# Patient Record
Sex: Male | Born: 1935 | Race: White | Hispanic: No | Marital: Married | Smoking: Former smoker
Health system: Southern US, Community
[De-identification: ages and names within clinical notes are randomized; demographics above are authoritative.]

## PROBLEM LIST (undated history)

## (undated) DIAGNOSIS — K5901 Slow transit constipation: Secondary | ICD-10-CM

## (undated) DIAGNOSIS — T148XXA Other injury of unspecified body region, initial encounter: Secondary | ICD-10-CM

## (undated) DIAGNOSIS — E785 Hyperlipidemia, unspecified: Secondary | ICD-10-CM

## (undated) DIAGNOSIS — R001 Bradycardia, unspecified: Secondary | ICD-10-CM

## (undated) DIAGNOSIS — I10 Essential (primary) hypertension: Secondary | ICD-10-CM

## (undated) DIAGNOSIS — R413 Other amnesia: Secondary | ICD-10-CM

## (undated) DIAGNOSIS — Z8546 Personal history of malignant neoplasm of prostate: Secondary | ICD-10-CM

## (undated) DIAGNOSIS — S99929A Unspecified injury of unspecified foot, initial encounter: Secondary | ICD-10-CM

## (undated) DIAGNOSIS — H66009 Acute suppurative otitis media without spontaneous rupture of ear drum, unspecified ear: Secondary | ICD-10-CM

## (undated) DIAGNOSIS — I872 Venous insufficiency (chronic) (peripheral): Secondary | ICD-10-CM

## (undated) DIAGNOSIS — S8990XA Unspecified injury of unspecified lower leg, initial encounter: Secondary | ICD-10-CM

## (undated) DIAGNOSIS — H409 Unspecified glaucoma: Secondary | ICD-10-CM

## (undated) DIAGNOSIS — I679 Cerebrovascular disease, unspecified: Secondary | ICD-10-CM

## (undated) DIAGNOSIS — R269 Unspecified abnormalities of gait and mobility: Secondary | ICD-10-CM

## (undated) DIAGNOSIS — M1990 Unspecified osteoarthritis, unspecified site: Secondary | ICD-10-CM

## (undated) DIAGNOSIS — G319 Degenerative disease of nervous system, unspecified: Secondary | ICD-10-CM

## (undated) DIAGNOSIS — K5730 Diverticulosis of large intestine without perforation or abscess without bleeding: Secondary | ICD-10-CM

## (undated) DIAGNOSIS — Z86010 Personal history of colonic polyps: Secondary | ICD-10-CM

## (undated) DIAGNOSIS — S99919A Unspecified injury of unspecified ankle, initial encounter: Secondary | ICD-10-CM

## (undated) DIAGNOSIS — F329 Major depressive disorder, single episode, unspecified: Secondary | ICD-10-CM

## (undated) HISTORY — DX: Major depressive disorder, single episode, unspecified: F32.9

## (undated) HISTORY — DX: Hyperlipidemia, unspecified: E78.5

## (undated) HISTORY — DX: Personal history of malignant neoplasm of prostate: Z85.46

## (undated) HISTORY — DX: Personal history of colonic polyps: Z86.010

## (undated) HISTORY — PX: OTHER SURGICAL HISTORY: SHX169

## (undated) HISTORY — DX: Slow transit constipation: K59.01

## (undated) HISTORY — DX: Unspecified injury of unspecified foot, initial encounter: S99.929A

## (undated) HISTORY — DX: Unspecified injury of unspecified ankle, initial encounter: S99.919A

## (undated) HISTORY — DX: Acute suppurative otitis media without spontaneous rupture of ear drum, unspecified ear: H66.009

## (undated) HISTORY — DX: Other injury of unspecified body region, initial encounter: T14.8XXA

## (undated) HISTORY — DX: Diverticulosis of large intestine without perforation or abscess without bleeding: K57.30

## (undated) HISTORY — DX: Bradycardia, unspecified: R00.1

## (undated) HISTORY — DX: Other amnesia: R41.3

## (undated) HISTORY — DX: Unspecified injury of unspecified lower leg, initial encounter: S89.90XA

## (undated) HISTORY — DX: Cerebrovascular disease, unspecified: I67.9

## (undated) HISTORY — DX: Unspecified abnormalities of gait and mobility: R26.9

## (undated) HISTORY — DX: Degenerative disease of nervous system, unspecified: G31.9

## (undated) HISTORY — DX: Essential (primary) hypertension: I10

## (undated) HISTORY — DX: Unspecified osteoarthritis, unspecified site: M19.90

## (undated) HISTORY — DX: Unspecified glaucoma: H40.9

## (undated) HISTORY — DX: Venous insufficiency (chronic) (peripheral): I87.2

---

## 2004-07-04 ENCOUNTER — Ambulatory Visit: Payer: Self-pay | Admitting: Internal Medicine

## 2004-07-15 ENCOUNTER — Ambulatory Visit: Payer: Self-pay

## 2004-09-28 ENCOUNTER — Ambulatory Visit: Payer: Self-pay | Admitting: Internal Medicine

## 2005-05-01 ENCOUNTER — Ambulatory Visit: Payer: Self-pay | Admitting: Gastroenterology

## 2005-05-12 ENCOUNTER — Ambulatory Visit: Payer: Self-pay | Admitting: Gastroenterology

## 2005-05-12 ENCOUNTER — Encounter (INDEPENDENT_AMBULATORY_CARE_PROVIDER_SITE_OTHER): Payer: Self-pay | Admitting: *Deleted

## 2005-05-12 HISTORY — PX: COLONOSCOPY: SHX174

## 2005-05-12 LAB — HM COLONOSCOPY

## 2006-02-07 ENCOUNTER — Ambulatory Visit: Payer: Self-pay | Admitting: Internal Medicine

## 2006-03-30 ENCOUNTER — Ambulatory Visit: Payer: Self-pay | Admitting: Internal Medicine

## 2006-04-27 ENCOUNTER — Ambulatory Visit: Payer: Self-pay | Admitting: Internal Medicine

## 2006-07-17 ENCOUNTER — Ambulatory Visit: Payer: Self-pay

## 2006-07-17 ENCOUNTER — Encounter: Payer: Self-pay | Admitting: Internal Medicine

## 2007-01-02 DIAGNOSIS — E785 Hyperlipidemia, unspecified: Secondary | ICD-10-CM

## 2007-01-02 DIAGNOSIS — F3289 Other specified depressive episodes: Secondary | ICD-10-CM

## 2007-01-02 DIAGNOSIS — Z8546 Personal history of malignant neoplasm of prostate: Secondary | ICD-10-CM | POA: Insufficient documentation

## 2007-01-02 DIAGNOSIS — F329 Major depressive disorder, single episode, unspecified: Secondary | ICD-10-CM

## 2007-01-02 HISTORY — DX: Other specified depressive episodes: F32.89

## 2007-01-02 HISTORY — DX: Major depressive disorder, single episode, unspecified: F32.9

## 2007-01-02 HISTORY — DX: Hyperlipidemia, unspecified: E78.5

## 2007-01-02 HISTORY — DX: Personal history of malignant neoplasm of prostate: Z85.46

## 2007-01-03 ENCOUNTER — Ambulatory Visit: Payer: Self-pay | Admitting: Internal Medicine

## 2007-01-03 DIAGNOSIS — I872 Venous insufficiency (chronic) (peripheral): Secondary | ICD-10-CM | POA: Insufficient documentation

## 2007-01-03 DIAGNOSIS — H409 Unspecified glaucoma: Secondary | ICD-10-CM | POA: Insufficient documentation

## 2007-01-03 HISTORY — DX: Unspecified glaucoma: H40.9

## 2007-01-03 HISTORY — DX: Venous insufficiency (chronic) (peripheral): I87.2

## 2007-01-03 LAB — CONVERTED CEMR LAB
Basophils Relative: 1.1 % — ABNORMAL HIGH (ref 0.0–1.0)
Bilirubin, Direct: 0.1 mg/dL (ref 0.0–0.3)
CO2: 34 meq/L — ABNORMAL HIGH (ref 19–32)
Cholesterol: 204 mg/dL (ref 0–200)
Creatinine, Ser: 1 mg/dL (ref 0.4–1.5)
Eosinophils Relative: 4.1 % (ref 0.0–5.0)
GFR calc Af Amer: 92 mL/min
Glucose, Bld: 85 mg/dL (ref 70–99)
HCT: 39.8 % (ref 39.0–52.0)
Hemoglobin: 14.1 g/dL (ref 13.0–17.0)
Lymphocytes Relative: 22 % (ref 12.0–46.0)
Monocytes Absolute: 0.7 10*3/uL (ref 0.2–0.7)
Monocytes Relative: 15.2 % — ABNORMAL HIGH (ref 3.0–11.0)
Neutro Abs: 2.7 10*3/uL (ref 1.4–7.7)
Neutrophils Relative %: 57.6 % (ref 43.0–77.0)
Potassium: 4.8 meq/L (ref 3.5–5.1)
RDW: 13 % (ref 11.5–14.6)
Sodium: 141 meq/L (ref 135–145)
TSH: 3.12 microintl units/mL (ref 0.35–5.50)
Total Bilirubin: 1.1 mg/dL (ref 0.3–1.2)
Total Protein: 6.6 g/dL (ref 6.0–8.3)
VLDL: 15 mg/dL (ref 0–40)
WBC: 4.8 10*3/uL (ref 4.5–10.5)

## 2007-05-07 ENCOUNTER — Ambulatory Visit: Payer: Self-pay | Admitting: Internal Medicine

## 2007-05-07 LAB — CONVERTED CEMR LAB: Cholesterol, target level: 200 mg/dL

## 2007-10-30 ENCOUNTER — Ambulatory Visit: Payer: Self-pay | Admitting: Internal Medicine

## 2007-10-30 DIAGNOSIS — I10 Essential (primary) hypertension: Secondary | ICD-10-CM

## 2007-10-30 HISTORY — DX: Essential (primary) hypertension: I10

## 2007-10-30 LAB — CONVERTED CEMR LAB
Albumin: 3.4 g/dL — ABNORMAL LOW (ref 3.5–5.2)
Alkaline Phosphatase: 55 units/L (ref 39–117)
Bilirubin, Direct: 0.1 mg/dL (ref 0.0–0.3)
Cholesterol: 192 mg/dL (ref 0–200)
LDL Cholesterol: 128 mg/dL — ABNORMAL HIGH (ref 0–99)
Total CHOL/HDL Ratio: 3.7
Total Protein: 6.4 g/dL (ref 6.0–8.3)
VLDL: 12 mg/dL (ref 0–40)

## 2007-11-05 ENCOUNTER — Ambulatory Visit: Payer: Self-pay | Admitting: Internal Medicine

## 2007-11-05 DIAGNOSIS — R350 Frequency of micturition: Secondary | ICD-10-CM | POA: Insufficient documentation

## 2007-11-05 LAB — CONVERTED CEMR LAB: TSH: 1.85 u[IU]/mL

## 2008-02-24 ENCOUNTER — Telehealth: Payer: Self-pay | Admitting: *Deleted

## 2008-02-26 ENCOUNTER — Telehealth: Payer: Self-pay | Admitting: Internal Medicine

## 2008-03-02 ENCOUNTER — Telehealth: Payer: Self-pay | Admitting: Internal Medicine

## 2008-03-06 ENCOUNTER — Ambulatory Visit: Payer: Self-pay | Admitting: Family Medicine

## 2008-03-11 ENCOUNTER — Telehealth: Payer: Self-pay | Admitting: Internal Medicine

## 2008-07-24 ENCOUNTER — Encounter: Payer: Self-pay | Admitting: Internal Medicine

## 2008-07-24 ENCOUNTER — Ambulatory Visit: Payer: Self-pay

## 2008-09-01 ENCOUNTER — Ambulatory Visit: Payer: Self-pay | Admitting: Internal Medicine

## 2008-09-01 ENCOUNTER — Telehealth: Payer: Self-pay | Admitting: Internal Medicine

## 2008-09-01 DIAGNOSIS — H66009 Acute suppurative otitis media without spontaneous rupture of ear drum, unspecified ear: Secondary | ICD-10-CM | POA: Insufficient documentation

## 2008-09-01 DIAGNOSIS — H6120 Impacted cerumen, unspecified ear: Secondary | ICD-10-CM

## 2008-09-01 HISTORY — DX: Acute suppurative otitis media without spontaneous rupture of ear drum, unspecified ear: H66.009

## 2008-09-10 ENCOUNTER — Telehealth: Payer: Self-pay | Admitting: Internal Medicine

## 2008-09-14 ENCOUNTER — Ambulatory Visit: Payer: Self-pay | Admitting: Internal Medicine

## 2008-09-14 DIAGNOSIS — T169XXA Foreign body in ear, unspecified ear, initial encounter: Secondary | ICD-10-CM | POA: Insufficient documentation

## 2009-01-27 ENCOUNTER — Ambulatory Visit: Payer: Self-pay | Admitting: Internal Medicine

## 2010-02-03 ENCOUNTER — Ambulatory Visit: Payer: Self-pay | Admitting: Internal Medicine

## 2010-02-28 ENCOUNTER — Telehealth: Payer: Self-pay | Admitting: Internal Medicine

## 2010-02-28 ENCOUNTER — Ambulatory Visit: Payer: Self-pay | Admitting: Internal Medicine

## 2010-02-28 DIAGNOSIS — S8990XA Unspecified injury of unspecified lower leg, initial encounter: Secondary | ICD-10-CM

## 2010-02-28 DIAGNOSIS — S99919A Unspecified injury of unspecified ankle, initial encounter: Secondary | ICD-10-CM

## 2010-02-28 DIAGNOSIS — S99929A Unspecified injury of unspecified foot, initial encounter: Secondary | ICD-10-CM

## 2010-02-28 HISTORY — DX: Unspecified injury of unspecified lower leg, initial encounter: S89.90XA

## 2010-05-11 NOTE — Assessment & Plan Note (Signed)
Summary: flu shot//lch  Nurse Visit   Review of Systems       Flu Vaccine Consent Questions     Do you have a history of severe allergic reactions to this vaccine? no    Any prior history of allergic reactions to egg and/or gelatin? no    Do you have a sensitivity to the preservative Thimersol? no    Do you have a past history of Guillan-Barre Syndrome? no    Do you currently have an acute febrile illness? no    Have you ever had a severe reaction to latex? no    Vaccine information given and explained to patient? yes    Are you currently pregnant? no    Lot Number:[id number]   Exp Date:10/08/2010   Site Given  Left Deltoid IM    Allergies: No Known Drug Allergies  Orders Added: 1)  Flu Vaccine 41yrs + MEDICARE PATIENTS [Q2039] 2)  Administration Flu vaccine - MCR [G0008]

## 2010-05-11 NOTE — Progress Notes (Signed)
Summary: Injured Ankle, request to be Worked In  Phone Note Call from Patient Call back at [facility] Phone [phone]   Caller: Patient Summary of Call: Pt injured left ankle on Friday, has been swollen since.   Would like to be worked in with Dr. Lovell Sheehan this afternoon, declined to see another physician. Initial call taken by: Trixie Dredge,  February 28, 2010 8:30 AM  New Problems: ANKLE INJURY (ICD-959.7)   New Problems: ANKLE INJURY (ICD-959.7)

## 2010-10-05 ENCOUNTER — Ambulatory Visit (INDEPENDENT_AMBULATORY_CARE_PROVIDER_SITE_OTHER): Payer: PRIVATE HEALTH INSURANCE | Admitting: Family Medicine

## 2010-10-05 ENCOUNTER — Encounter: Payer: Self-pay | Admitting: Family Medicine

## 2010-10-05 DIAGNOSIS — I10 Essential (primary) hypertension: Secondary | ICD-10-CM

## 2010-10-05 DIAGNOSIS — K5900 Constipation, unspecified: Secondary | ICD-10-CM

## 2010-10-05 LAB — TSH: TSH: 1.76 u[IU]/mL (ref 0.35–5.50)

## 2010-10-05 NOTE — Patient Instructions (Addendum)
Constipation in Adults Constipation is having fewer than 2 bowel movements per week. Usually, the stools are hard. As we grow older, constipation is more common. If you try to fix constipation with laxatives, the problem may get worse. This is because laxatives taken over a long period of time make the colon muscles weaker. A low-fiber diet, not taking in enough fluids, and taking some medicines may make these problems worse. MEDICATIONS THAT MAY CAUSE CONSTIPATION  Water pills (diuretics).  Calcium channel blockers (used to control blood pressure and for the heart).   Certain pain medicines (narcotics).   Anticholinergics.  Anti-inflammatory agents.   Antacids that contain aluminum.   DISEASES THAT CONTRIBUTE TO CONSTIPATION  Diabetes.  Parkinson's disease.   Dementia.   Stroke.  Depression.   Illnesses that cause problems with salt and water metabolism.   HOME CARE INSTRUCTIONS  Constipation is usually best cared for without medicines. Increasing dietary fiber and eating more fruits and vegetables is the best way to manage constipation.   Slowly increase fiber intake to 25 to 38 grams per day. Whole grains, fruits, vegetables, and legumes are good sources of fiber. A dietitian can further help you incorporate high-fiber foods into your diet.   Drink enough water and fluids to keep your urine clear or pale yellow.   A fiber supplement may be added to your diet if you cannot get enough fiber from foods.   Increasing your activities also helps improve regularity.   Suppositories, as suggested by your caregiver, will also help. If you are using antacids, such as aluminum or calcium containing products, it will be helpful to switch to products containing magnesium if your caregiver says it is okay.   If you have been given a liquid injection (enema) today, this is only a temporary measure. It should not be relied on for treatment of longstanding (chronic) constipation.    Stronger measures, such as magnesium sulfate, should be avoided if possible. This may cause uncontrollable diarrhea. Using magnesium sulfate may not allow you time to make it to the bathroom.  SEEK IMMEDIATE MEDICAL CARE IF:  There is bright red blood in the stool.   The constipation stays for more than 4 days.   There is belly (abdominal) or rectal pain.   You do not seem to be getting better.   You have any questions or concerns.  MAKE SURE YOU:  Understand these instructions.   Will watch your condition.   Will get help right away if you are not doing well or get worse.  Document Released: 12/24/2003 Document Re-Released: 06/21/2009 [facility] Patient Information 2011 [place], [place].  Walk more More fluid intake Increase fiber intake Consider short term use of Miralax.

## 2010-10-05 NOTE — Progress Notes (Signed)
  Subjective:    Patient ID: Hector Pratt, male    DOB: [birth year], 77 y.o.   MRN: [id number]  HPI Patient seen with progression of constipation. Intermittent constipation issues for at least one year. He is having less frequent stools and also different consistency with increased firmness and smaller caliber. He's tried prune juice, stool softener, and some type of tablet laxative without improvement. He had colonoscopy around 2007. No bloody stools. No pain with stools. Somewhat less active with ambulation recently. Over the past couple weeks bowel movement about every 3 days. No appetite or signif weight changes.  History of glaucoma and takes a couple of glaucoma drops but no other medications. No other anticholinergics. History of borderline elevated blood pressure but not to any medications. Drinks about 4 glasses of water per day   Review of Systems  Constitutional: Positive for activity change. Negative for fever, chills, appetite change, fatigue and unexpected weight change.  Cardiovascular: Negative for chest pain.  Gastrointestinal: Positive for constipation. Negative for nausea, vomiting, abdominal pain, diarrhea, blood in stool, abdominal distention and anal bleeding.  Genitourinary: Negative for dysuria.  Hematological: Negative for adenopathy. Does not bruise/bleed easily.       Objective:   Physical Exam  Constitutional: He is oriented to person, place, and time. He appears well-developed and well-nourished. No distress.  HENT:  Mouth/Throat: Oropharynx is clear and moist. No oropharyngeal exudate.  Neck: Neck supple. No thyromegaly present.  Cardiovascular: Normal rate and regular rhythm.  Exam reveals no gallop.   Pulmonary/Chest: Effort normal and breath sounds normal. No respiratory distress. He has no wheezes. He has no rales.  Abdominal: Soft. Bowel sounds are normal. He exhibits no distension and no mass. There is no tenderness. There is no rebound and no  guarding.  Genitourinary:       No impaction. No rectal mass. Moderate amount of relatively soft stool in rectal vault. Question of slight anal sphincter laxity  Musculoskeletal: He exhibits no edema.  Lymphadenopathy:    He has no cervical adenopathy.  Neurological: He is alert and oriented to person, place, and time.  Psychiatric: He has a normal mood and affect. His behavior is normal.          Assessment & Plan:  Progressive constipation. Check TSH to rule out hypothyroidism.  more fluids. Increase ambulation. Increase fiber intake. Consider short-term use of MiraLax and followup with primary physician no better 2-3 weeks Borderline elevated blood pressure. Continue close monitoring

## 2010-10-06 NOTE — Progress Notes (Signed)
Quick Note:  Pt wife informed ______ 

## 2010-10-07 ENCOUNTER — Telehealth: Payer: Self-pay | Admitting: Family Medicine

## 2010-10-07 ENCOUNTER — Telehealth: Payer: Self-pay | Admitting: *Deleted

## 2010-10-07 DIAGNOSIS — K5909 Other constipation: Secondary | ICD-10-CM

## 2010-10-07 NOTE — Telephone Encounter (Signed)
Requesting gi referral for chronic constipation

## 2010-10-07 NOTE — Telephone Encounter (Signed)
Pt calling, requesting Dr. Caryl Never call him about a treatment they discussed 2 days ago. He has questions. Will be available later this afternoon or in the next 20 minutes.

## 2010-10-07 NOTE — Telephone Encounter (Signed)
Pt returning call to [facility]

## 2010-10-07 NOTE — Telephone Encounter (Signed)
Ok for referral per dr Lovell Sheehan -referral  To gi

## 2010-10-10 ENCOUNTER — Telehealth: Payer: Self-pay | Admitting: *Deleted

## 2010-10-10 NOTE — Telephone Encounter (Signed)
Hold miralax and increase natural fiber-whole grains, high fiber cereals, etc,  Drink plenty of fluids and re-evaluate with primary if no better in 2-3 days.

## 2010-10-10 NOTE — Telephone Encounter (Signed)
Pt came in on the 6/27 to see Dr Caryl Never for constipation.  He took the miralax for 3 days per Dr Lucie Leather instructions and on the 3rd day he finally had a bowel movement.  Now he has diarrhea and he is no longer on the miralax.  Diarrhea x2 days

## 2010-10-11 ENCOUNTER — Encounter: Payer: Self-pay | Admitting: Cardiovascular Disease

## 2010-10-11 NOTE — Telephone Encounter (Signed)
Pt. Notified.

## 2010-10-13 ENCOUNTER — Other Ambulatory Visit: Payer: Self-pay | Admitting: *Deleted

## 2010-11-04 ENCOUNTER — Ambulatory Visit (INDEPENDENT_AMBULATORY_CARE_PROVIDER_SITE_OTHER): Payer: PRIVATE HEALTH INSURANCE | Admitting: Internal Medicine

## 2010-11-04 ENCOUNTER — Encounter: Payer: Self-pay | Admitting: Internal Medicine

## 2010-11-04 VITALS — BP 130/70 | HR 64 | Temp 98.2°F | Resp 16 | Ht 72.0 in | Wt 156.0 lb

## 2010-11-04 DIAGNOSIS — K5901 Slow transit constipation: Secondary | ICD-10-CM | POA: Insufficient documentation

## 2010-11-04 DIAGNOSIS — E785 Hyperlipidemia, unspecified: Secondary | ICD-10-CM

## 2010-11-04 DIAGNOSIS — I10 Essential (primary) hypertension: Secondary | ICD-10-CM

## 2010-11-04 HISTORY — DX: Slow transit constipation: K59.01

## 2010-11-04 NOTE — Progress Notes (Signed)
Subjective:    Patient ID: Hector Pratt, male    DOB: 08/28/1919, 77 y.o.   MRN: [id number]  HPI for approximately the last year the patient has noticed some increasing problems with constipation.  He has not experienced abdominal pain he has not been on any new medications that could cause constipation he does not believe that he has significantly changed his diet but over the past. He has noticed that about every third day he has a hard bowel movement that he describes as being ball-like or pellet like he does not have pain with that nor does he note bleeding but he used to have to take some sort of a laxative to help.  He was seen by my partner who evaluated him and told him to take MiraLax but he found this to irritative He denies any weight loss fever chills excessive abdominal pain or discomfort    Review of Systems  Constitutional: Negative for fever and fatigue.  HENT: Negative for hearing loss, congestion, neck pain and postnasal drip.   Eyes: Negative for discharge, redness and visual disturbance.  Respiratory: Negative for cough, shortness of breath and wheezing.   Cardiovascular: Negative for leg swelling.  Gastrointestinal: Negative for abdominal pain, constipation and abdominal distention.  Genitourinary: Negative for urgency and frequency.  Musculoskeletal: Negative for joint swelling and arthralgias.  Skin: Negative for color change and rash.  Neurological: Negative for weakness and light-headedness.  Hematological: Negative for adenopathy.  Psychiatric/Behavioral: Negative for behavioral problems.   Past Medical History  Diagnosis Date  . HYPERLIPIDEMIA 01/02/2007  . DEPRESSION 01/02/2007  . GLAUCOMA NOS 01/03/2007  . CERUMEN IMPACTION 09/01/2008  . ACUT SUPPRATV OTITIS MEDIA W/O SPONT RUP EARDRUM 09/01/2008  . HYPERTENSION 10/30/2007  . VENOUS INSUFFICIENCY, CHRONIC 01/03/2007  . ANKLE INJURY 02/28/2010  . PROSTATE CANCER, HX OF 01/02/2007  . DJD (degenerative  joint disease)    Past Surgical History  Procedure Date  . Colonoscopy     reports that he quit smoking about 53 years ago. His smoking use included Cigarettes. He has a 19 pack-year smoking history. He does not have any smokeless tobacco history on file. He reports that he drinks alcohol. His drug history not on file. family history includes Alcohol abuse in his other; Cancer (age of onset:50) in his other; Prostate cancer in his other; and Stroke (age of onset:50) in his other. No Known Allergies     Objective:   Physical Exam  Nursing note and vitals reviewed. Constitutional: He appears well-developed and well-nourished.  HENT:  Head: Normocephalic and atraumatic.  Eyes: Conjunctivae are normal. Pupils are equal, round, and reactive to light.  Neck: Normal range of motion. Neck supple.  Cardiovascular: Normal rate and regular rhythm.   Pulmonary/Chest: Effort normal and breath sounds normal.  Abdominal: Soft. Bowel sounds are normal.       Normal bowel sounds in all 4 quadrants no masses or tenderness          Assessment & Plan:  A new diagnosis of slow Transit constipation.  We discussed in detail the etiology of slow transit constipation and a solution which is twofold 1 increasing his fluid intake and to increasing his fiber intake.  We gave him samples of Metamucil to take a glass of water every day and Senokot to use as a laxative of choice should he get more than 2 days without a bowel movement or feel any distention.  He will present in one to 2 months for complete  physical examination we'll complete his evaluation however there are no warning signs such as bloody stools abdominal pain weight loss fever or chills at this time

## 2010-11-04 NOTE — Patient Instructions (Addendum)
The type of constipation you have is called slow transit constipation You treat this by doing things that will allow moisture to remain in the stool all the way to its  Exit or to your bowel movement Adding fiber like Metamucil once a day on a daily basis and taking Senokot laxative when you have not had a bowel movement for 2 or more days will keep the bowels moving in a normal  Stay away from harsh laxatives such as ducolax magnesium citrate or even MiraLax.    Be sure you drink several glasses of water every day to keep moisture in your stool

## 2010-11-21 ENCOUNTER — Ambulatory Visit: Payer: PRIVATE HEALTH INSURANCE | Admitting: Gastroenterology

## 2010-11-29 ENCOUNTER — Ambulatory Visit (INDEPENDENT_AMBULATORY_CARE_PROVIDER_SITE_OTHER): Payer: PRIVATE HEALTH INSURANCE | Admitting: *Deleted

## 2010-11-29 DIAGNOSIS — Z23 Encounter for immunization: Secondary | ICD-10-CM

## 2010-11-29 DIAGNOSIS — Z0000 Encounter for general adult medical examination without abnormal findings: Secondary | ICD-10-CM

## 2010-12-26 ENCOUNTER — Telehealth: Payer: Self-pay | Admitting: Internal Medicine

## 2010-12-26 NOTE — Telephone Encounter (Signed)
Ov given for 10-2

## 2010-12-26 NOTE — Telephone Encounter (Signed)
Pt has a cpx scheduled for 02/06/11 and needs to be worked in sooner because he will be moving into a retirement home on 01/27/11 and will not be able to move in until he has a current cpx. Can we work this pt in sooner? Or would he be able to see another doctor for this?

## 2011-01-05 ENCOUNTER — Other Ambulatory Visit: Payer: PRIVATE HEALTH INSURANCE

## 2011-01-10 ENCOUNTER — Encounter: Payer: Self-pay | Admitting: Internal Medicine

## 2011-01-10 ENCOUNTER — Ambulatory Visit (INDEPENDENT_AMBULATORY_CARE_PROVIDER_SITE_OTHER): Payer: PRIVATE HEALTH INSURANCE | Admitting: Internal Medicine

## 2011-01-10 VITALS — BP 120/80 | HR 72 | Temp 98.2°F | Resp 14 | Ht 70.0 in | Wt 158.0 lb

## 2011-01-10 DIAGNOSIS — Z8546 Personal history of malignant neoplasm of prostate: Secondary | ICD-10-CM

## 2011-01-10 DIAGNOSIS — I872 Venous insufficiency (chronic) (peripheral): Secondary | ICD-10-CM

## 2011-01-10 DIAGNOSIS — Z0000 Encounter for general adult medical examination without abnormal findings: Secondary | ICD-10-CM

## 2011-01-10 DIAGNOSIS — Z23 Encounter for immunization: Secondary | ICD-10-CM

## 2011-01-10 DIAGNOSIS — T887XXA Unspecified adverse effect of drug or medicament, initial encounter: Secondary | ICD-10-CM

## 2011-01-10 DIAGNOSIS — I10 Essential (primary) hypertension: Secondary | ICD-10-CM

## 2011-01-10 DIAGNOSIS — F329 Major depressive disorder, single episode, unspecified: Secondary | ICD-10-CM

## 2011-01-10 DIAGNOSIS — E785 Hyperlipidemia, unspecified: Secondary | ICD-10-CM

## 2011-01-10 LAB — BASIC METABOLIC PANEL
BUN: 20 mg/dL (ref 6–23)
Calcium: 8.7 mg/dL (ref 8.4–10.5)
Creatinine, Ser: 1.1 mg/dL (ref 0.4–1.5)
GFR: 69.18 mL/min (ref >60.00)
Glucose, Bld: 95 mg/dL (ref 70–99)

## 2011-01-10 LAB — CBC WITH DIFFERENTIAL/PLATELET
Basophils Absolute: 0 10*3/uL (ref 0.0–0.1)
Eosinophils Absolute: 0.1 10*3/uL (ref 0.0–0.7)
Hemoglobin: 13.6 g/dL (ref 13.0–17.0)
Lymphocytes Relative: 20.3 % (ref 12.0–46.0)
MCHC: 33.7 g/dL (ref 30.0–36.0)
Neutro Abs: 4 10*3/uL (ref 1.4–7.7)
Neutrophils Relative %: 67.2 % (ref 43.0–77.0)
Platelets: 245 10*3/uL (ref 150.0–400.0)
RDW: 13.9 % (ref 11.5–14.6)

## 2011-01-10 LAB — HEPATIC FUNCTION PANEL
Albumin: 3.9 g/dL (ref 3.5–5.2)
Total Bilirubin: 0.9 mg/dL (ref 0.3–1.2)

## 2011-01-10 LAB — POCT URINALYSIS DIPSTICK
Glucose, UA: NEGATIVE
Ketones, UA: NEGATIVE
Leukocytes, UA: NEGATIVE
Spec Grav, UA: 1.015
Urobilinogen, UA: 0.2

## 2011-01-10 LAB — TSH: TSH: 1.74 u[IU]/mL (ref 0.35–5.50)

## 2011-01-10 LAB — LIPID PANEL
Cholesterol: 213 mg/dL — ABNORMAL HIGH (ref 0–200)
HDL: 69 mg/dL (ref >39.00)
Total CHOL/HDL Ratio: 3
Triglycerides: 53 mg/dL (ref 0.0–149.0)
VLDL: 10.6 mg/dL (ref 0.0–40.0)

## 2011-01-10 NOTE — Progress Notes (Signed)
Subjective:    Patient ID: Hector Pratt, male    DOB: [birth year], 77 y.o.   MRN: [id number]  HPI  Pt is a pleasant 77 year old white male who presents for followup of hyperlipidemia, hypertension a history of chronic venous insufficiency a history of benign prostatic hypertrophy with frequency and obstructive symptoms following treatment for prostate cancer.  He also has a history of slow transit constipation.  He presents today for a form completion for assisted living and a medication review as well. He denies any new symptoms other than his constipation seems to have worsened slightly. Discussed diet changes which may play a round of constipation as well as the need to stay hydrated  Review of Systems  Constitutional: Negative for fever and fatigue.  HENT: Negative for hearing loss, congestion, neck pain and postnasal drip.   Eyes: Negative for discharge, redness and visual disturbance.  Respiratory: Negative for cough, shortness of breath and wheezing.   Cardiovascular: Negative for leg swelling.  Gastrointestinal: Negative for abdominal pain, constipation and abdominal distention.  Genitourinary: Negative for urgency and frequency.  Musculoskeletal: Negative for joint swelling and arthralgias.  Skin: Negative for color change and rash.  Neurological: Negative for weakness and light-headedness.  Hematological: Negative for adenopathy.  Psychiatric/Behavioral: Negative for behavioral problems.       Past Medical History  Diagnosis Date  . HYPERLIPIDEMIA 01/02/2007  . DEPRESSION 01/02/2007  . GLAUCOMA NOS 01/03/2007  . CERUMEN IMPACTION 09/01/2008  . ACUT SUPPRATV OTITIS MEDIA W/O SPONT RUP EARDRUM 09/01/2008  . HYPERTENSION 10/30/2007  . VENOUS INSUFFICIENCY, CHRONIC 01/03/2007  . ANKLE INJURY 02/28/2010  . PROSTATE CANCER, HX OF 01/02/2007  . DJD (degenerative joint disease)     History   Social History  . Marital Status: Married    Spouse Name: N/A    Number of  Children: N/A  . Years of Education: N/A   Occupational History  . Retired    Social History Main Topics  . Smoking status: Former Smoker -- 1.0 packs/day for 19 years    Types: Cigarettes    Quit date: 10/04/1957  . Smokeless tobacco: Not on file  . Alcohol Use: Yes  . Drug Use: Not on file  . Sexually Active: Yes   Other Topics Concern  . Not on file   Social History Narrative  . No narrative on file    Past Surgical History  Procedure Date  . Colonoscopy     Family History  Problem Relation Age of Onset  . Alcohol abuse Other   . Cancer Other 50    relative  . Stroke Other 50    relative  . Prostate cancer Other     1st degree relative <50    No Known Allergies  Current Outpatient Prescriptions on File Prior to Visit  Medication Sig Dispense Refill  . bimatoprost (LUMIGAN) 0.03 % ophthalmic solution 1 drop daily.        . carteolol (OCUPRESS) 1 % ophthalmic solution 0.5 mLs 2 (two) times daily.        . Omega-3 Fatty Acids (FISH OIL CONCENTRATE) 1000 MG CAPS Take 2,000 mg by mouth 2 (two) times daily.          BP 120/80  Pulse 72  Temp 98.2 F (36.8 C)  Resp 14  Ht 5\' 10"  (1.778 m)  Wt 158 lb (71.668 kg)  BMI 22.67 kg/m2    Objective:   Physical Exam  Nursing note and vitals reviewed. Constitutional: He appears well-developed  and well-nourished.  HENT:  Head: Normocephalic and atraumatic.  Eyes: Conjunctivae are normal. Pupils are equal, round, and reactive to light.  Neck: Normal range of motion. Neck supple.  Cardiovascular: Normal rate and regular rhythm.   Pulmonary/Chest: Effort normal and breath sounds normal.  Abdominal: Soft. Bowel sounds are normal.          Assessment & Plan:  Medication reconciliation form was completed his assisted living facility We discussed compliance with all his medications including his drops for glaucoma Currently he is on omega-3 fatty acids only for control of his cholesterol. His blood pressure has  been controlled with diet and salt restriction. He denies any chest pain shortness of breath PND or orthopnea this time he appears very stable in the past has been treated for depression counseled the patient that in these transitions of care from home tot with assisted living, depression may again become apparent and if it does he will contact the office.

## 2011-01-12 LAB — TB SKIN TEST: TB Skin Test: NEGATIVE mm

## 2011-01-18 ENCOUNTER — Encounter: Payer: PRIVATE HEALTH INSURANCE | Admitting: Internal Medicine

## 2011-01-30 ENCOUNTER — Other Ambulatory Visit: Payer: PRIVATE HEALTH INSURANCE

## 2011-02-06 ENCOUNTER — Encounter: Payer: Self-pay | Admitting: Internal Medicine

## 2011-02-06 ENCOUNTER — Ambulatory Visit (INDEPENDENT_AMBULATORY_CARE_PROVIDER_SITE_OTHER): Payer: PRIVATE HEALTH INSURANCE | Admitting: Internal Medicine

## 2011-02-06 DIAGNOSIS — Z0000 Encounter for general adult medical examination without abnormal findings: Secondary | ICD-10-CM

## 2011-02-06 DIAGNOSIS — I10 Essential (primary) hypertension: Secondary | ICD-10-CM

## 2011-02-06 NOTE — Progress Notes (Signed)
  Subjective:    Patient ID: Hector Pratt, male    DOB: 11/28/1919, 77 y.o.   MRN: [id number]  HPI CPX   Review of Systems  Constitutional: Negative for fever and fatigue.  HENT: Negative for hearing loss, congestion, neck pain and postnasal drip.   Eyes: Negative for discharge, redness and visual disturbance.  Respiratory: Negative for cough, shortness of breath and wheezing.   Cardiovascular: Negative for leg swelling.  Gastrointestinal: Negative for abdominal pain, constipation and abdominal distention.  Genitourinary: Negative for urgency and frequency.  Musculoskeletal: Negative for joint swelling and arthralgias.  Skin: Negative for color change and rash.  Neurological: Negative for weakness and light-headedness.  Hematological: Negative for adenopathy.  Psychiatric/Behavioral: Negative for behavioral problems.   Past Medical History  Diagnosis Date  . HYPERLIPIDEMIA 01/02/2007  . DEPRESSION 01/02/2007  . GLAUCOMA NOS 01/03/2007  . CERUMEN IMPACTION 09/01/2008  . ACUT SUPPRATV OTITIS MEDIA W/O SPONT RUP EARDRUM 09/01/2008  . HYPERTENSION 10/30/2007  . VENOUS INSUFFICIENCY, CHRONIC 01/03/2007  . ANKLE INJURY 02/28/2010  . PROSTATE CANCER, HX OF 01/02/2007  . DJD (degenerative joint disease)    Past Surgical History  Procedure Date  . Colonoscopy     reports that he quit smoking about 53 years ago. His smoking use included Cigarettes. He has a 19 pack-year smoking history. He does not have any smokeless tobacco history on file. He reports that he drinks alcohol. His drug history not on file. family history includes Alcohol abuse in his other; Cancer (age of onset:50) in his other; Prostate cancer in his other; and Stroke (age of onset:50) in his other. No Known Allergies     Objective:   Physical Exam  Nursing note and vitals reviewed. Constitutional: He appears well-developed and well-nourished.  HENT:  Head: Normocephalic and atraumatic.  Eyes: Conjunctivae are  normal. Pupils are equal, round, and reactive to light.  Neck: Normal range of motion. Neck supple.  Cardiovascular: Normal rate and regular rhythm.   Pulmonary/Chest: Effort normal and breath sounds normal.  Abdominal: Soft. Bowel sounds are normal.          Assessment & Plan:   Patient presents for yearly preventative medicine examination.   all immunizations and health maintenance protocols were reviewed with the patient and they are up to date with these protocols.   screening laboratory values were reviewed with the patient including screening of hyperlipidemia PSA renal function and hepatic function.   There medications past medical history social history problem list and allergies were reviewed in detail.   Goals were established with regard to weight loss exercise diet in compliance with medications EKG was normal

## 2011-02-06 NOTE — Patient Instructions (Signed)
Patient was instructed to continue all medications as prescribed. To stop at the checkout desk and schedule a followup appointment  

## 2011-02-10 ENCOUNTER — Encounter: Payer: Self-pay | Admitting: Internal Medicine

## 2011-04-10 NOTE — Patient Instructions (Signed)
The patient is instructed to continue all medications as prescribed. Schedule followup with check out clerk upon leaving the clinic  

## 2011-05-09 ENCOUNTER — Emergency Department (HOSPITAL_COMMUNITY)
Admission: EM | Admit: 2011-05-09 | Discharge: 2011-05-09 | Disposition: A | Discharge status: Home / Self Care | Payer: No Typology Code available for payment source | Attending: Emergency Medicine | Admitting: Emergency Medicine

## 2011-05-09 ENCOUNTER — Encounter (HOSPITAL_COMMUNITY): Payer: Self-pay | Admitting: Emergency Medicine

## 2011-05-09 ENCOUNTER — Ambulatory Visit (INDEPENDENT_AMBULATORY_CARE_PROVIDER_SITE_OTHER): Payer: No Typology Code available for payment source | Admitting: Family

## 2011-05-09 ENCOUNTER — Encounter: Payer: Self-pay | Admitting: Family

## 2011-05-09 VITALS — BP 142/80 | Temp 99.3°F | Wt 161.0 lb

## 2011-05-09 DIAGNOSIS — R059 Cough, unspecified: Secondary | ICD-10-CM

## 2011-05-09 DIAGNOSIS — Z043 Encounter for examination and observation following other accident: Secondary | ICD-10-CM | POA: Insufficient documentation

## 2011-05-09 DIAGNOSIS — R05 Cough: Secondary | ICD-10-CM

## 2011-05-09 DIAGNOSIS — H409 Unspecified glaucoma: Secondary | ICD-10-CM | POA: Insufficient documentation

## 2011-05-09 DIAGNOSIS — J209 Acute bronchitis, unspecified: Secondary | ICD-10-CM

## 2011-05-09 DIAGNOSIS — E785 Hyperlipidemia, unspecified: Secondary | ICD-10-CM | POA: Insufficient documentation

## 2011-05-09 DIAGNOSIS — I10 Essential (primary) hypertension: Secondary | ICD-10-CM | POA: Insufficient documentation

## 2011-05-09 DIAGNOSIS — F329 Major depressive disorder, single episode, unspecified: Secondary | ICD-10-CM | POA: Insufficient documentation

## 2011-05-09 DIAGNOSIS — Z79899 Other long term (current) drug therapy: Secondary | ICD-10-CM | POA: Insufficient documentation

## 2011-05-09 DIAGNOSIS — Z8546 Personal history of malignant neoplasm of prostate: Secondary | ICD-10-CM | POA: Insufficient documentation

## 2011-05-09 DIAGNOSIS — F3289 Other specified depressive episodes: Secondary | ICD-10-CM | POA: Insufficient documentation

## 2011-05-09 MED ORDER — AMOXICILLIN-POT CLAVULANATE 875-125 MG PO TABS: 1.0000 | ORAL_TABLET | Freq: Two times a day (BID) | ORAL | Status: AC

## 2011-05-09 NOTE — Patient Instructions (Addendum)

## 2011-05-09 NOTE — Progress Notes (Signed)
Subjective:    Patient ID: Hector Pratt, male    DOB: November 11, 1919, 77 y.o.   MRN: [id number]  Cough The current episode started in the past 7 days. The problem has been gradually worsening. The cough is productive of sputum. Associated symptoms include nasal congestion, postnasal drip and a sore throat. The symptoms are aggravated by nothing. He has tried prescription cough suppressant and OTC cough suppressant for the symptoms. The treatment provided no relief. His past medical history is significant for bronchitis and pneumonia.      Review of Systems  Constitutional: Positive for fatigue.  HENT: Positive for congestion, sore throat and postnasal drip.   Eyes: Negative.   Respiratory: Positive for cough.   Cardiovascular: Negative.   Musculoskeletal: Negative.   Skin: Negative.   Hematological: Negative.   Psychiatric/Behavioral: Negative.    Past Medical History  Diagnosis Date  . HYPERLIPIDEMIA 01/02/2007  . DEPRESSION 01/02/2007  . GLAUCOMA NOS 01/03/2007  . CERUMEN IMPACTION 09/01/2008  . ACUT SUPPRATV OTITIS MEDIA W/O SPONT RUP EARDRUM 09/01/2008  . HYPERTENSION 10/30/2007  . VENOUS INSUFFICIENCY, CHRONIC 01/03/2007  . ANKLE INJURY 02/28/2010  . PROSTATE CANCER, HX OF 01/02/2007  . DJD (degenerative joint disease)     History   Social History  . Marital Status: Married    Spouse Name: N/A    Number of Children: N/A  . Years of Education: N/A   Occupational History  . Retired    Social History Main Topics  . Smoking status: Former Smoker -- 1.0 packs/day for 19 years    Types: Cigarettes    Quit date: 10/04/1957  . Smokeless tobacco: Not on file  . Alcohol Use: Yes  . Drug Use: Not on file  . Sexually Active: Yes   Other Topics Concern  . Not on file   Social History Narrative  . No narrative on file    Past Surgical History  Procedure Date  . Colonoscopy     Family History  Problem Relation Age of Onset  . Alcohol abuse Other   . Cancer Other  50    relative  . Stroke Other 50    relative  . Prostate cancer Other     1st degree relative <50    No Known Allergies  Current Outpatient Prescriptions on File Prior to Visit  Medication Sig Dispense Refill  . bimatoprost (LUMIGAN) 0.03 % ophthalmic solution 1 drop daily.        . carteolol (OCUPRESS) 1 % ophthalmic solution 0.5 mLs 2 (two) times daily.        . Omega-3 Fatty Acids (FISH OIL CONCENTRATE) 1000 MG CAPS Take 2,000 mg by mouth 2 (two) times daily.          BP 142/80  Temp(Src) 99.3 F (37.4 C) (Oral)  Wt 161 lb (73.029 kg)chart    Objective:   Physical Exam  Constitutional: He is oriented to person, place, and time. He appears well-developed and well-nourished.  HENT:  Right Ear: External ear normal.  Left Ear: External ear normal.  Nose: Nose normal.  Mouth/Throat: Oropharynx is clear and moist.  Neck: Normal range of motion. Neck supple.  Cardiovascular: Normal rate, regular rhythm and normal heart sounds.   Pulmonary/Chest: Effort normal and breath sounds normal.  Neurological: He is alert and oriented to person, place, and time.  Skin: Skin is warm and dry.  Psychiatric: He has a normal mood and affect.          Assessment &  Plan:  Assessment: Bronchitis, cough  Plan: Augmentin 875 one by mouth twice a day x10 days. Continue over-the-counter symptomatic treatment for relief. Rest. Drink plenty of fluids. Call the office if symptoms worsen or persist, recheck as scheduled, and when necessary.
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
## 2011-05-12 ENCOUNTER — Ambulatory Visit (INDEPENDENT_AMBULATORY_CARE_PROVIDER_SITE_OTHER): Payer: No Typology Code available for payment source | Admitting: Family

## 2011-05-12 ENCOUNTER — Encounter: Payer: Self-pay | Admitting: Family

## 2011-05-12 VITALS — BP 118/60 | HR 64 | Temp 98.1°F

## 2011-05-12 DIAGNOSIS — J209 Acute bronchitis, unspecified: Secondary | ICD-10-CM

## 2011-05-12 DIAGNOSIS — H6980 Other specified disorders of Eustachian tube, unspecified ear: Secondary | ICD-10-CM

## 2011-05-12 MED ORDER — PREDNISONE 20 MG PO TABS: 40.0000 mg | ORAL_TABLET | Freq: Every day | ORAL | Status: AC

## 2011-05-12 MED ORDER — PREDNISONE 20 MG PO TABS: 40.0000 mg | ORAL_TABLET | Freq: Every day | ORAL | Status: DC

## 2011-05-12 NOTE — Patient Instructions (Signed)
Barotitis Media Barotitis media is soreness (inflammation) of the area behind the eardrum (middle ear). This occurs when the auditory tube (Eustachian tube) leading from the back of the throat to the eardrum is blocked. When it is blocked air cannot move in and out of the middle ear to equalize pressure changes. These pressure changes come from changes in altitude when:  Flying.   Driving in the mountains.   Diving.  Problems are more likely to occur with pressure changes during times when you are congested as from:  Hay fever.   Upper respiratory infection.   A cold.  Damage or hearing loss (barotrauma) caused by this may be permanent. HOME CARE INSTRUCTIONS   Use medicines as recommended by your caregiver. Over the counter medicines will help unblock the canal and can help during times of air travel.   Do not put anything into your ears to clean or unplug them. Eardrops will not be helpful.   Do not swim, dive, or fly until your caregiver says it is all right to do so. If these activities are necessary, chewing gum with frequent swallowing may help. It is also helpful to hold your nose and gently blow to pop your ears for equalizing pressure changes. This forces air into the Eustachian tube.   For little ones with problems, give your baby a bottle of water or juice during periods when pressure changes would be anticipated such as during take offs and landings associated with air travel.   Only take over-the-counter or prescription medicines for pain, discomfort, or fever as directed by your caregiver.   A decongestant may be helpful in de-congesting the middle ear and make pressure equalization easier. This can be even more effective if the drops (spray) are delivered with the head lying over the edge of a bed with the head tilted toward the ear on the affected side.   If your caregiver has given you a follow-up appointment, it is very important to keep that appointment. Not keeping  the appointment could result in a chronic or permanent injury, pain, hearing loss and disability. If there is any problem keeping the appointment, you must call back to this facility for assistance.  SEEK IMMEDIATE MEDICAL CARE IF:   You develop a severe headache, dizziness, severe ear pain, or bloody or pus-like drainage from your ears.   An oral temperature above 102 F (38.9 C) develops.   Your problems do not improve or become worse.  MAKE SURE YOU:   Understand these instructions.   Will watch your condition.   Will get help right away if you are not doing well or get worse.  Document Released: 03/24/2000 Document Revised: 12/07/2010 Document Reviewed: 10/31/2007 [employer] Patient Information 2012 [employer]. 
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
## 2011-05-24 ENCOUNTER — Encounter: Payer: Self-pay | Admitting: Internal Medicine

## 2011-05-24 ENCOUNTER — Ambulatory Visit (INDEPENDENT_AMBULATORY_CARE_PROVIDER_SITE_OTHER): Payer: No Typology Code available for payment source | Admitting: Internal Medicine

## 2011-05-24 VITALS — BP 150/80 | HR 76 | Temp 98.3°F | Resp 16 | Ht 72.0 in | Wt 160.0 lb

## 2011-05-24 DIAGNOSIS — IMO0001 Reserved for inherently not codable concepts without codable children: Secondary | ICD-10-CM

## 2011-05-24 DIAGNOSIS — R05 Cough: Secondary | ICD-10-CM

## 2011-05-24 DIAGNOSIS — J449 Chronic obstructive pulmonary disease, unspecified: Secondary | ICD-10-CM

## 2011-05-24 DIAGNOSIS — R059 Cough, unspecified: Secondary | ICD-10-CM

## 2011-05-24 DIAGNOSIS — J4489 Other specified chronic obstructive pulmonary disease: Secondary | ICD-10-CM

## 2011-05-24 MED ORDER — BENZONATATE 100 MG PO CAPS: 100.0000 mg | ORAL_CAPSULE | Freq: Three times a day (TID) | ORAL | Status: AC | PRN

## 2011-05-24 MED ORDER — LEVOFLOXACIN 500 MG PO TABS: 500.0000 mg | ORAL_TABLET | Freq: Every day | ORAL | Status: AC
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
## 2011-06-09 ENCOUNTER — Ambulatory Visit (INDEPENDENT_AMBULATORY_CARE_PROVIDER_SITE_OTHER): Payer: Medicare Other | Admitting: Internal Medicine

## 2011-06-09 ENCOUNTER — Encounter: Payer: Self-pay | Admitting: Internal Medicine

## 2011-06-09 VITALS — BP 130/70 | HR 72 | Temp 98.2°F | Resp 16 | Ht 72.0 in | Wt 152.0 lb

## 2011-06-09 DIAGNOSIS — R490 Dysphonia: Secondary | ICD-10-CM

## 2011-06-09 DIAGNOSIS — K219 Gastro-esophageal reflux disease without esophagitis: Secondary | ICD-10-CM

## 2011-06-09 DIAGNOSIS — M7752 Other enthesopathy of left foot: Secondary | ICD-10-CM

## 2011-06-09 DIAGNOSIS — M659 Synovitis and tenosynovitis, unspecified: Secondary | ICD-10-CM

## 2011-06-09 DIAGNOSIS — R05 Cough: Secondary | ICD-10-CM

## 2011-06-09 NOTE — Patient Instructions (Signed)
Use the nasal spray that I'm going to give you one spray in each nostril once a day also obtain Prilosec and take one 20 mg Prilosec daily
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
## 2011-08-07 ENCOUNTER — Ambulatory Visit: Payer: PRIVATE HEALTH INSURANCE | Admitting: Internal Medicine

## 2011-08-18 ENCOUNTER — Ambulatory Visit: Payer: Self-pay | Admitting: Internal Medicine

## 2011-10-09 ENCOUNTER — Encounter: Payer: Self-pay | Admitting: Internal Medicine

## 2011-10-09 ENCOUNTER — Ambulatory Visit (INDEPENDENT_AMBULATORY_CARE_PROVIDER_SITE_OTHER): Payer: Medicare Other | Admitting: Internal Medicine

## 2011-10-09 VITALS — BP 130/80 | HR 72 | Temp 98.6°F | Resp 16 | Ht 72.0 in | Wt 162.0 lb

## 2011-10-09 DIAGNOSIS — L570 Actinic keratosis: Secondary | ICD-10-CM

## 2011-10-09 DIAGNOSIS — K5901 Slow transit constipation: Secondary | ICD-10-CM

## 2011-10-09 DIAGNOSIS — E785 Hyperlipidemia, unspecified: Secondary | ICD-10-CM

## 2011-10-09 DIAGNOSIS — T887XXA Unspecified adverse effect of drug or medicament, initial encounter: Secondary | ICD-10-CM

## 2011-10-09 DIAGNOSIS — I10 Essential (primary) hypertension: Secondary | ICD-10-CM

## 2011-10-09 LAB — BASIC METABOLIC PANEL
BUN: 21 mg/dL (ref 6–23)
CO2: 30 mEq/L (ref 19–32)
Calcium: 9.2 mg/dL (ref 8.4–10.5)
Chloride: 103 mEq/L (ref 96–112)
Creatinine, Ser: 1 mg/dL (ref 0.4–1.5)
Glucose, Bld: 104 mg/dL — ABNORMAL HIGH (ref 70–99)

## 2011-10-09 LAB — CBC WITH DIFFERENTIAL/PLATELET
Eosinophils Absolute: 0.1 10*3/uL (ref 0.0–0.7)
Eosinophils Relative: 2.2 % (ref 0.0–5.0)
HCT: 38.7 % — ABNORMAL LOW (ref 39.0–52.0)
Lymphs Abs: 1.4 10*3/uL (ref 0.7–4.0)
MCHC: 33.7 g/dL (ref 30.0–36.0)
MCV: 98 fl (ref 78.0–100.0)
Monocytes Absolute: 0.8 10*3/uL (ref 0.1–1.0)
Neutrophils Relative %: 58.3 % (ref 43.0–77.0)
Platelets: 222 10*3/uL (ref 150.0–400.0)
RDW: 13.5 % (ref 11.5–14.6)
WBC: 5.6 10*3/uL (ref 4.5–10.5)

## 2011-10-09 LAB — LIPID PANEL
Cholesterol: 204 mg/dL — ABNORMAL HIGH (ref 0–200)
HDL: 61.8 mg/dL (ref >39.00)
Triglycerides: 112 mg/dL (ref 0.0–149.0)

## 2011-10-09 LAB — HEPATIC FUNCTION PANEL
ALT: 16 U/L (ref 0–53)
Albumin: 3.6 g/dL (ref 3.5–5.2)
Total Protein: 6.7 g/dL (ref 6.0–8.3)

## 2011-10-09 LAB — TSH: TSH: 2.14 u[IU]/mL (ref 0.35–5.50)

## 2011-10-09 MED ORDER — LUBIPROSTONE 8 MCG PO CAPS: 8.0000 ug | ORAL_CAPSULE | Freq: Two times a day (BID) | ORAL | Status: AC

## 2011-10-09 NOTE — Patient Instructions (Signed)
Take the amitiza every day

## 2011-10-09 NOTE — Progress Notes (Signed)
Subjective:    Patient ID: Hector Pratt, male    DOB: 07-11-1919, 78 y.o.   MRN: [id number]  HPI  Patient presents for routine monitoring of hyperlipidemia history of glaucoma in the setting of controlled hypertension history of prostate cancer history of mild to moderate venous insufficiency.  He presents today without acute complaints about his chronic condition but with 2 new complaints one is actinic keratoses that were diagnosed by his dermatologist and he treated them with a topical therapy. 2 or 3 small areas persist on the scalp that he is questioning whether or not they need to be treated.  He also has persistent constipation related to slow transit and requires laxatives. We discussed the use of a motility drug  Review of Systems  Constitutional: Positive for fatigue.  Gastrointestinal: Positive for constipation.  Skin: Positive for color change.  Neurological: Positive for weakness.   Past Medical History  Diagnosis Date  . HYPERLIPIDEMIA 01/02/2007  . DEPRESSION 01/02/2007  . GLAUCOMA NOS 01/03/2007  . CERUMEN IMPACTION 09/01/2008  . ACUT SUPPRATV OTITIS MEDIA W/O SPONT RUP EARDRUM 09/01/2008  . HYPERTENSION 10/30/2007  . VENOUS INSUFFICIENCY, CHRONIC 01/03/2007  . ANKLE INJURY 02/28/2010  . PROSTATE CANCER, HX OF 01/02/2007  . DJD (degenerative joint disease)     History   Social History  . Marital Status: Married    Spouse Name: N/A    Number of Children: N/A  . Years of Education: N/A   Occupational History  . Retired    Social History Main Topics  . Smoking status: Former Smoker -- 1.0 packs/day for 19 years    Types: Cigarettes    Quit date: 10/04/1957  . Smokeless tobacco: Not on file  . Alcohol Use: Yes  . Drug Use: Not on file  . Sexually Active: Yes   Other Topics Concern  . Not on file   Social History Narrative  . No narrative on file    Past Surgical History  Procedure Date  . Colonoscopy     Family History  Problem Relation Age  of Onset  . Alcohol abuse Other   . Cancer Other 50    relative  . Stroke Other 50    relative  . Prostate cancer Other     1st degree relative <50    No Known Allergies  Current Outpatient Prescriptions on File Prior to Visit  Medication Sig Dispense Refill  . carteolol (OCUPRESS) 1 % ophthalmic solution Place 0.5 mLs into both eyes 2 (two) times daily.       Marland Kitchen dextromethorphan-guaiFENesin (MUCINEX DM) 30-600 MG per 12 hr tablet Take 1 tablet by mouth 2 (two) times daily as needed. For cough & congestion      . latanoprost (XALATAN) 0.005 % ophthalmic solution Place 1 drop into both eyes at bedtime.      . Omega-3 Fatty Acids (FISH OIL CONCENTRATE) 1000 MG CAPS Take 1,000 mg by mouth daily.       . vitamin C (ASCORBIC ACID) 500 MG tablet Take 1,000 mg by mouth daily.        BP 130/80  Pulse 72  Temp 98.6 F (37 C)  Resp 16  Ht 6' (1.829 m)  Wt 162 lb (73.483 kg)  BMI 21.97 kg/m2       Objective:   Physical Exam  Nursing note and vitals reviewed. Constitutional: He appears well-developed and well-nourished.  HENT:  Head: Normocephalic and atraumatic.  Eyes: Conjunctivae are normal. Pupils are equal, round, and  reactive to light.  Neck: Normal range of motion. Neck supple.  Cardiovascular: Normal rate and regular rhythm.   Pulmonary/Chest: Effort normal and breath sounds normal.  Abdominal: Soft. Bowel sounds are normal.          Assessment & Plan:  We will treat the 2 actinic keratosis on the scalp with cryotherapy  Informed consent was obtained in the lesions on his scalp werre treated for 60 seconds of liquid nitrogen application the patient tolerated the procedure well as procedural care was discussed with the patient and instructions should the lesion reappears contact our office immediately   We'll monitor a lipid and liver today to monitor the effect of his lipid control.  We will go be met for control of his hypertension and renal function.  A CBC  differential for potential drug effects. For his constipation we'll begin amitiza 25 mg by mouth daily

## 2011-10-25 ENCOUNTER — Encounter: Payer: Self-pay | Admitting: Family Medicine

## 2011-10-25 ENCOUNTER — Ambulatory Visit (INDEPENDENT_AMBULATORY_CARE_PROVIDER_SITE_OTHER): Payer: Medicare Other | Admitting: Family Medicine

## 2011-10-25 VITALS — BP 124/72 | HR 67 | Temp 98.2°F | Wt 161.0 lb

## 2011-10-25 DIAGNOSIS — J40 Bronchitis, not specified as acute or chronic: Secondary | ICD-10-CM

## 2011-10-25 MED ORDER — AZITHROMYCIN 250 MG PO TABS: ORAL_TABLET | ORAL | Status: AC

## 2011-10-25 MED ORDER — HYDROCODONE-HOMATROPINE 5-1.5 MG/5ML PO SYRP: 5.0000 mL | ORAL_SOLUTION | ORAL | Status: AC | PRN

## 2011-10-25 NOTE — Progress Notes (Signed)
  Subjective:    Patient ID: Hector Pratt, male    DOB: [birth year], 78 y.o.   MRN: [id number]  HPI Here for one week of chest congestion and coughing up yellow sputum. No chest pain or fever. On Mucinex DM.    Review of Systems  Constitutional: Negative.   HENT: Negative.   Eyes: Negative.   Respiratory: Positive for cough and chest tightness. Negative for shortness of breath and wheezing.   Cardiovascular: Negative.        Objective:   Physical Exam  Constitutional: He appears well-developed and well-nourished.  HENT:  Right Ear: External ear normal.  Left Ear: External ear normal.  Nose: Nose normal.  Mouth/Throat: Oropharynx is clear and moist.  Eyes: Conjunctivae are normal.  Neck: No thyromegaly present.  Pulmonary/Chest: Effort normal. No respiratory distress. He has no wheezes. He has no rales.       Scattered rhonchi   Lymphadenopathy:    He has no cervical adenopathy.          Assessment & Plan:  Recheck prn

## 2011-10-26 ENCOUNTER — Ambulatory Visit: Payer: Medicare Other | Admitting: Family

## 2011-10-30 ENCOUNTER — Telehealth: Payer: Self-pay | Admitting: Family Medicine

## 2011-10-30 NOTE — Telephone Encounter (Signed)
Patient walked in with a slip from [facility] on a Rx he had from (10-25-11) for Azithromycin 250mg  6 pills no refills. He states he needs it refilled, and he needs it today, per pt. Is something you normally refill? Please advise. Thanks.

## 2011-10-31 NOTE — Telephone Encounter (Signed)
I spoke with the pt, and he declined an OV at this time, stating he thought he may be improving. He is aware that the abx will not be refilled, and he would need an OV in the future if he feels he needs something more for his illness.

## 2011-10-31 NOTE — Telephone Encounter (Signed)
He was given this for a bronchitis. If he is not yet well, he needs another OV

## 2011-11-01 ENCOUNTER — Ambulatory Visit (INDEPENDENT_AMBULATORY_CARE_PROVIDER_SITE_OTHER): Payer: Medicare Other | Admitting: Family Medicine

## 2011-11-01 ENCOUNTER — Ambulatory Visit (INDEPENDENT_AMBULATORY_CARE_PROVIDER_SITE_OTHER)
Admission: RE | Admit: 2011-11-01 | Discharge: 2011-11-01 | Disposition: A | Discharge status: Home / Self Care | Payer: Medicare Other | Source: Ambulatory Visit | Attending: Family Medicine | Admitting: Family Medicine

## 2011-11-01 ENCOUNTER — Encounter: Payer: Self-pay | Admitting: Family Medicine

## 2011-11-01 VITALS — BP 126/70 | HR 67 | Temp 98.3°F | Wt 160.0 lb

## 2011-11-01 DIAGNOSIS — J189 Pneumonia, unspecified organism: Secondary | ICD-10-CM

## 2011-11-01 MED ORDER — LEVOFLOXACIN 500 MG PO TABS: 500.0000 mg | ORAL_TABLET | Freq: Every day | ORAL | Status: AC

## 2011-11-01 NOTE — Progress Notes (Signed)
  Subjective:    Patient ID: Hector Pratt, male    DOB: Aug 20, 1919, 78 y.o.   MRN: [id number]  HPI Here to recheck after a visit here on 10-25-11 for a bronchitis. He took a Zpack but he has not improved at all. The chest is still tight and he has a cough that is mostly dry. No fever or chest pain.   Review of Systems  Constitutional: Negative.   HENT: Negative.   Eyes: Negative.   Respiratory: Positive for cough, chest tightness and shortness of breath. Negative for wheezing.   Cardiovascular: Negative.        Objective:   Physical Exam  Constitutional: He appears well-developed and well-nourished.  HENT:  Right Ear: External ear normal.  Left Ear: External ear normal.  Nose: Nose normal.  Mouth/Throat: Oropharynx is clear and moist.  Eyes: Conjunctivae are normal.  Neck: No thyromegaly present.  Cardiovascular: Normal rate, regular rhythm, normal heart sounds and intact distal pulses.   Pulmonary/Chest: Effort normal. No respiratory distress. He has no wheezes.       Slight rales are present in the right posterior base   Lymphadenopathy:    He has no cervical adenopathy.          Assessment & Plan:  RLL pneumonia. We will get a CXR today , and start on Levaquin 500 mg for 10 days. Drink plenty of fluids.

## 2011-11-02 NOTE — Progress Notes (Signed)
Quick Note:  I spoke with pt ______ 

## 2012-01-05 ENCOUNTER — Ambulatory Visit (INDEPENDENT_AMBULATORY_CARE_PROVIDER_SITE_OTHER): Payer: Medicare Other

## 2012-01-05 DIAGNOSIS — Z23 Encounter for immunization: Secondary | ICD-10-CM

## 2012-08-26 ENCOUNTER — Encounter: Payer: Self-pay | Admitting: Family Medicine

## 2012-08-26 ENCOUNTER — Ambulatory Visit (INDEPENDENT_AMBULATORY_CARE_PROVIDER_SITE_OTHER): Payer: Medicare Other | Admitting: Family Medicine

## 2012-08-26 VITALS — BP 132/70 | Temp 98.3°F | Wt 163.0 lb

## 2012-08-26 DIAGNOSIS — L0232 Furuncle of buttock: Secondary | ICD-10-CM

## 2012-08-26 DIAGNOSIS — L0233 Carbuncle of buttock: Secondary | ICD-10-CM

## 2012-08-26 MED ORDER — DOXYCYCLINE HYCLATE 100 MG PO TABS: 100.0000 mg | ORAL_TABLET | Freq: Two times a day (BID) | ORAL | Status: DC

## 2012-08-26 NOTE — Progress Notes (Signed)
Chief Complaint  Patient presents with  . Cyst    on buttocks    HPI:  Acute visit for "cyst" on buttock: -noticed a few weeks ago -has had these before and sometimes come to head -has tried nothing -had some swelling and tenderness - now feeling better -denies: fevers, drainage, malaise  ROS: See pertinent positives and negatives per HPI.  Past Medical History  Diagnosis Date  . HYPERLIPIDEMIA 01/02/2007  . DEPRESSION 01/02/2007  . GLAUCOMA NOS 01/03/2007  . CERUMEN IMPACTION 09/01/2008  . ACUT SUPPRATV OTITIS MEDIA W/O SPONT RUP EARDRUM 09/01/2008  . HYPERTENSION 10/30/2007  . VENOUS INSUFFICIENCY, CHRONIC 01/03/2007  . ANKLE INJURY 02/28/2010  . PROSTATE CANCER, HX OF 01/02/2007  . DJD (degenerative joint disease)     Family History  Problem Relation Age of Onset  . Alcohol abuse Other   . Cancer Other 50    relative  . Stroke Other 50    relative  . Prostate cancer Other     1st degree relative <50    History   Social History  . Marital Status: Married    Spouse Name: N/A    Number of Children: N/A  . Years of Education: N/A   Occupational History  . Retired    Social History Main Topics  . Smoking status: Former Smoker -- 1.00 packs/day for 19 years    Types: Cigarettes    Quit date: 10/04/1957  . Smokeless tobacco: Never Used  . Alcohol Use: No  . Drug Use: No  . Sexually Active: Yes   Other Topics Concern  . None   Social History Narrative  . None    Current outpatient prescriptions:carteolol (OCUPRESS) 1 % ophthalmic solution, Place 0.5 mLs into both eyes 2 (two) times daily. , Disp: , Rfl: ;  dextromethorphan-guaiFENesin (MUCINEX DM) 30-600 MG per 12 hr tablet, Take 1 tablet by mouth 2 (two) times daily as needed. For cough & congestion, Disp: , Rfl: ;  glucosamine-chondroitin 500-400 MG tablet, Take 1 tablet by mouth daily., Disp: , Rfl:  latanoprost (XALATAN) 0.005 % ophthalmic solution, Place 1 drop into both eyes at bedtime., Disp: , Rfl: ;   Omega-3 Fatty Acids (FISH OIL CONCENTRATE) 1000 MG CAPS, Take 1,000 mg by mouth daily. , Disp: , Rfl: ;  vitamin C (ASCORBIC ACID) 500 MG tablet, Take 1,000 mg by mouth daily., Disp: , Rfl: ;  doxycycline (VIBRA-TABS) 100 MG tablet, Take 1 tablet (100 mg total) by mouth 2 (two) times daily., Disp: 20 tablet, Rfl: 0  EXAM:  Filed Vitals:   08/26/12 1510  BP: 132/70  Temp: 98.3 F (36.8 C)    Body mass index is 22.1 kg/(m^2).  GENERAL: vitals reviewed and listed above, alert, oriented, appears well hydrated and in no acute distress  HEENT: atraumatic, conjunttiva clear, no obvious abnormalities on inspection of external nose and ears  NECK: no obvious masses on inspection  SKIN: small papule R buttock with head and surrounding erythema of skin  MS: moves all extremities without noticeable abnormality  PSYCH: pleasant and cooperative, no obvious depression or anxiety  ASSESSMENT AND PLAN:  Discussed the following assessment and plan:  Boil of buttock - Plan: doxycycline (VIBRA-TABS) 100 MG tablet, Wound culture, DISCONTINUED: doxycycline (VIBRA-TABS) 100 MG tablet  -material expressed from small boil sent for culture, given surrounding skin erythema starting doxy for cellulitis/mild - risks discussed. Advied compresses and  Checking wound several times per day to ensure not worsening. -return for recheck in 48  hours or sooner if any worsening -Patient advised to return or notify a doctor immediately if symptoms worsen or persist or new concerns arise.  Patient Instructions       Terressa Koyanagi.

## 2012-08-28 ENCOUNTER — Ambulatory Visit: Payer: Medicare Other | Admitting: Family Medicine

## 2012-08-29 ENCOUNTER — Encounter: Payer: Self-pay | Admitting: Family Medicine

## 2012-08-29 ENCOUNTER — Ambulatory Visit (INDEPENDENT_AMBULATORY_CARE_PROVIDER_SITE_OTHER): Payer: Medicare Other | Admitting: Family Medicine

## 2012-08-29 VITALS — BP 132/70 | Temp 98.2°F | Wt 162.0 lb

## 2012-08-29 DIAGNOSIS — L723 Sebaceous cyst: Secondary | ICD-10-CM

## 2012-08-29 LAB — WOUND CULTURE: Gram Stain: NONE SEEN

## 2012-08-29 NOTE — Progress Notes (Signed)
Chief Complaint  Patient presents with  . Follow-up    HPI:  Follow up inflamed cyst on buttock: -seen 3 days ago and was draining spontaneously - culture sent, neg to date -advised compresses and doxy -reports: doing much better, less pain and swelling, doing compresses and abx -denies: fevers, chills, drainage, malaise  ROS: See pertinent positives and negatives per HPI.  Past Medical History  Diagnosis Date  . HYPERLIPIDEMIA 01/02/2007  . DEPRESSION 01/02/2007  . GLAUCOMA NOS 01/03/2007  . CERUMEN IMPACTION 09/01/2008  . ACUT SUPPRATV OTITIS MEDIA W/O SPONT RUP EARDRUM 09/01/2008  . HYPERTENSION 10/30/2007  . VENOUS INSUFFICIENCY, CHRONIC 01/03/2007  . ANKLE INJURY 02/28/2010  . PROSTATE CANCER, HX OF 01/02/2007  . DJD (degenerative joint disease)     Family History  Problem Relation Age of Onset  . Alcohol abuse Other   . Cancer Other 50    relative  . Stroke Other 50    relative  . Prostate cancer Other     1st degree relative <50    History   Social History  . Marital Status: Married    Spouse Name: N/A    Number of Children: N/A  . Years of Education: N/A   Occupational History  . Retired    Social History Main Topics  . Smoking status: Former Smoker -- 1.00 packs/day for 19 years    Types: Cigarettes    Quit date: 10/04/1957  . Smokeless tobacco: Never Used  . Alcohol Use: No  . Drug Use: No  . Sexually Active: Yes   Other Topics Concern  . None   Social History Narrative  . None    Current outpatient prescriptions:carteolol (OCUPRESS) 1 % ophthalmic solution, Place 0.5 mLs into both eyes 2 (two) times daily. , Disp: , Rfl: ;  dextromethorphan-guaiFENesin (MUCINEX DM) 30-600 MG per 12 hr tablet, Take 1 tablet by mouth 2 (two) times daily as needed. For cough & congestion, Disp: , Rfl: ;  doxycycline (VIBRA-TABS) 100 MG tablet, Take 1 tablet (100 mg total) by mouth 2 (two) times daily., Disp: 20 tablet, Rfl: 0 glucosamine-chondroitin 500-400 MG  tablet, Take 1 tablet by mouth daily., Disp: , Rfl: ;  latanoprost (XALATAN) 0.005 % ophthalmic solution, Place 1 drop into both eyes at bedtime., Disp: , Rfl: ;  Omega-3 Fatty Acids (FISH OIL CONCENTRATE) 1000 MG CAPS, Take 1,000 mg by mouth daily. , Disp: , Rfl: ;  vitamin C (ASCORBIC ACID) 500 MG tablet, Take 1,000 mg by mouth daily., Disp: , Rfl:   EXAM:  Filed Vitals:   08/29/12 1544  BP: 132/70  Temp: 98.2 F (36.8 C)    Body mass index is 21.97 kg/(m^2).  GENERAL: vitals reviewed and listed above, alert, oriented, appears well hydrated and in no acute distress  HEENT: atraumatic, conjunttiva clear, no obvious abnormalities on inspection of external nose and ears  NECK: no obvious masses on inspection  SKIN: much improved appearance of inflamed cyst on R buttock - small subcutaneous swelling approx 1cm in diameter with small amount surround erythema and induration, central punctate - small amount white material expressed  MS: moves all extremities without noticeable abnormality  PSYCH: pleasant and cooperative, no obvious depression or anxiety  ASSESSMENT AND PLAN:  Discussed the following assessment and plan:  Inflamed sebaceous cyst  -improved, culture negative to date -patient prefers no procedure - advised continued compresses and finish abx - follow up in one week or sooner if worsens, may cancel if resolved. -Patient advised  to return or notify a doctor immediately if symptoms worsen or persist or new concerns arise.  There are no Patient Instructions on file for this visit.   Hector Pratt R.

## 2013-05-19 ENCOUNTER — Ambulatory Visit (INDEPENDENT_AMBULATORY_CARE_PROVIDER_SITE_OTHER): Payer: Medicare Other | Admitting: Family Medicine

## 2013-05-19 ENCOUNTER — Encounter: Payer: Self-pay | Admitting: Family Medicine

## 2013-05-19 VITALS — BP 140/80 | Temp 99.1°F | Wt 159.0 lb

## 2013-05-19 DIAGNOSIS — A088 Other specified intestinal infections: Secondary | ICD-10-CM

## 2013-05-19 DIAGNOSIS — A084 Viral intestinal infection, unspecified: Secondary | ICD-10-CM

## 2013-05-19 MED ORDER — ONDANSETRON HCL 4 MG PO TABS: 4.0000 mg | ORAL_TABLET | Freq: Three times a day (TID) | ORAL | Status: DC | PRN

## 2013-05-19 NOTE — Progress Notes (Signed)
Pre visit review using our clinic review tool, if applicable. No additional management support is needed unless otherwise documented below in the visit note. 

## 2013-05-19 NOTE — Patient Instructions (Addendum)
-  plenty of fluids with electrolytes  -imodium   -no dairy  -follow up as needed - see doctor immediately if unable to tolerate fluids, dizziness, feeling sicker

## 2013-05-19 NOTE — Progress Notes (Signed)
Chief Complaint  Patient presents with  . Fever    nausea, diarrhea, vomiting     HPI:  Doc of Day visit for:  Nausea, vomiting, watery diarrhea, low grade fever: -started: 2 days ago -denies: severe abd pain, inabiity to tol fluids, SOB, resp symptoms, blood in stool or emesis -sick contacts: wife with same symptoms last week   ROS: See pertinent positives and negatives per HPI.  Past Medical History  Diagnosis Date  . HYPERLIPIDEMIA 01/02/2007  . DEPRESSION 01/02/2007  . GLAUCOMA NOS 01/03/2007  . CERUMEN IMPACTION 09/01/2008  . ACUT SUPPRATV OTITIS MEDIA W/O SPONT RUP EARDRUM 09/01/2008  . HYPERTENSION 10/30/2007  . VENOUS INSUFFICIENCY, CHRONIC 01/03/2007  . ANKLE INJURY 02/28/2010  . PROSTATE CANCER, HX OF 01/02/2007  . DJD (degenerative joint disease)     Past Surgical History  Procedure Laterality Date  . Colonoscopy      Family History  Problem Relation Age of Onset  . Alcohol abuse Other   . Cancer Other 50    relative  . Stroke Other 50    relative  . Prostate cancer Other     1st degree relative <50    History   Social History  . Marital Status: Married    Spouse Name: N/A    Number of Children: N/A  . Years of Education: N/A   Occupational History  . Retired    Social History Main Topics  . Smoking status: Former Smoker -- 1.00 packs/day for 19 years    Types: Cigarettes    Quit date: 10/04/1957  . Smokeless tobacco: Never Used  . Alcohol Use: No  . Drug Use: No  . Sexual Activity: Yes   Other Topics Concern  . None   Social History Narrative  . None    Current outpatient prescriptions:carteolol (OCUPRESS) 1 % ophthalmic solution, Place 0.5 mLs into both eyes 2 (two) times daily. , Disp: , Rfl: ;  dextromethorphan-guaiFENesin (MUCINEX DM) 30-600 MG per 12 hr tablet, Take 1 tablet by mouth 2 (two) times daily as needed. For cough & congestion, Disp: , Rfl: ;  glucosamine-chondroitin 500-400 MG tablet, Take 1 tablet by mouth daily., Disp: ,  Rfl:  latanoprost (XALATAN) 0.005 % ophthalmic solution, Place 1 drop into both eyes at bedtime., Disp: , Rfl: ;  Omega-3 Fatty Acids (FISH OIL CONCENTRATE) 1000 MG CAPS, Take 1,000 mg by mouth daily. , Disp: , Rfl: ;  vitamin C (ASCORBIC ACID) 500 MG tablet, Take 1,000 mg by mouth daily., Disp: , Rfl: ;  ondansetron (ZOFRAN) 4 MG tablet, Take 1 tablet (4 mg total) by mouth every 8 (eight) hours as needed for nausea or vomiting., Disp: 20 tablet, Rfl: 0  EXAM:  Filed Vitals:   05/19/13 1520  BP: 140/80  Temp: 99.1 F (37.3 C)    Body mass index is 21.56 kg/(m^2).  GENERAL: vitals reviewed and listed above, alert, oriented, appears well hydrated and in no acute distress  HEENT: atraumatic, conjunttiva clear, no obvious abnormalities on inspection of external nose and ears  NECK: no obvious masses on inspection  LUNGS: clear to auscultation bilaterally, no wheezes, rales or rhonchi, good air movement  CV: HRRR, no peripheral edema  ABD: BS+, soft, NTTP  MS: moves all extremities without noticeable abnormality  PSYCH: pleasant and cooperative, no obvious depression or anxiety  ASSESSMENT AND PLAN:  Discussed the following assessment and plan:  Viral gastroenteritis - Plan: ondansetron (ZOFRAN) 4 MG tablet  -we discussed possible serious and likely  etiologies, workup and treatment, treatment risks and return precautions -after this discussion, Darol opted for supportive care -of course, we advised Rune  to return or notify a doctor immediately if symptoms worsen or persist or new concerns arise.  .  -Patient advised to return or notify a doctor immediately if symptoms worsen or persist or new concerns arise.  Patient Instructions  -plenty of fluids with electrolytes  -imodium   -no dairy  -follow up as needed - go to hospital if unable to tolerate fluids, dizziness, feeling sicker     KIM, HANNAH R.

## 2013-11-11 ENCOUNTER — Ambulatory Visit (INDEPENDENT_AMBULATORY_CARE_PROVIDER_SITE_OTHER): Payer: Medicare Other | Admitting: Family Medicine

## 2013-11-11 ENCOUNTER — Encounter: Payer: Self-pay | Admitting: Family Medicine

## 2013-11-11 VITALS — BP 150/80 | HR 56 | Temp 97.9°F | Wt 161.0 lb

## 2013-11-11 DIAGNOSIS — I499 Cardiac arrhythmia, unspecified: Secondary | ICD-10-CM

## 2013-11-11 DIAGNOSIS — E785 Hyperlipidemia, unspecified: Secondary | ICD-10-CM

## 2013-11-11 DIAGNOSIS — L570 Actinic keratosis: Secondary | ICD-10-CM

## 2013-11-11 DIAGNOSIS — R001 Bradycardia, unspecified: Secondary | ICD-10-CM

## 2013-11-11 DIAGNOSIS — I10 Essential (primary) hypertension: Secondary | ICD-10-CM

## 2013-11-11 DIAGNOSIS — I498 Other specified cardiac arrhythmias: Secondary | ICD-10-CM

## 2013-11-11 NOTE — Progress Notes (Signed)
Hector Reddish, MD Phone: [phone]  Subjective:  Patient presents today to establish care. Chief complaint-noted.   Scaly red spots on head 6 months noted. Seem to be more and more noticeable. Has a dermatologist.  ROS- no weight loss, fever, chills, expanding redness  Irregular heart beat With bradycardia Patient denies any symptoms. Noted on exam. Seems to change with breaths.  ROS- no palpitations or chest pain. Denies fatigue.   Hypertension BP Readings from Last 3 Encounters:  11/11/13 150/80  05/19/13 140/80  08/29/12 132/70  Home BP monitoring-no Compliant with medications-not currently on medications ROS-Denies any CP, HA, SOB, blurry vision, LE edema.  Hyperlipidemia On statin: no. Last LDL 113. No history cva or MI.  ROS- no chest pain or shortness of breath. No myalgias  The following were reviewed and entered/updated in epic: Past Medical History  Diagnosis Date  . HYPERLIPIDEMIA 01/02/2007  . DEPRESSION 01/02/2007    patient denies history  . GLAUCOMA NOS 01/03/2007  . CERUMEN IMPACTION 09/01/2008  . ACUT SUPPRATV OTITIS MEDIA W/O SPONT RUP EARDRUM 09/01/2008    patient denies rupture  . HYPERTENSION 10/30/2007  . VENOUS INSUFFICIENCY, CHRONIC 01/03/2007  . ANKLE INJURY 02/28/2010  . PROSTATE CANCER, HX OF 01/02/2007  . DJD (degenerative joint disease)    Patient Active Problem List   Diagnosis Date Noted  . Sinus bradycardia 11/11/2013    Priority: Medium  . HYPERTENSION 10/30/2007    Priority: Medium  . HYPERLIPIDEMIA 01/02/2007    Priority: Medium  . Actinic keratosis 11/11/2013    Priority: Low  . Slow transit constipation 11/04/2010    Priority: Low  . GLAUCOMA NOS 01/03/2007    Priority: Low  . VENOUS INSUFFICIENCY, CHRONIC 01/03/2007    Priority: Low  . PROSTATE CANCER, HX OF 01/02/2007    Priority: Low   Past Surgical History  Procedure Laterality Date  . Colonoscopy    . Lymph node removal      associated with prostate cancer     Family History  Problem Relation Age of Onset  . Alcohol abuse Other   . Cancer Other 50    relative  . Stroke Other 50    relative  . Prostate cancer Other     1st degree relative <50    Medications- reviewed and updated Current Outpatient Prescriptions  Medication Sig Dispense Refill  . carteolol (OCUPRESS) 1 % ophthalmic solution Place 0.5 mLs into both eyes 2 (two) times daily.       Marland Kitchen glucosamine-chondroitin 500-400 MG tablet Take 1 tablet by mouth daily.      Marland Kitchen latanoprost (XALATAN) 0.005 % ophthalmic solution Place 1 drop into both eyes at bedtime.      . Omega-3 Fatty Acids (FISH OIL CONCENTRATE) 1000 MG CAPS Take 1,000 mg by mouth daily.       . vitamin C (ASCORBIC ACID) 500 MG tablet Take 1,000 mg by mouth daily.       No current facility-administered medications for this visit.    Allergies-reviewed and updated No Known Allergies  History   Social History  . Marital Status: Married    Spouse Name: N/A    Number of Children: N/A  . Years of Education: N/A   Occupational History  . Retired    Social History Main Topics  . Smoking status: Former Smoker -- 1.00 packs/day for 19 years    Types: Cigarettes    Quit date: 10/04/1957  . Smokeless tobacco: Never Used  . Alcohol Use: No  .  Drug Use: No  . Sexual Activity: Yes   Other Topics Concern  . None   Social History Narrative   Retired in 1989 from [employer] to [employer] in 89 from [place] (10 years)   Lives with wife. Lives in retirement home-independent living [place].    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    Married 25 years, lost 1st wife due to cancer. 2 children, wife has 1 children. Son worked for [employer] and lived 3 years ago. 1 grandkid on wife's side, 2 on her side. No great grandkids.       Hobbies: used to play golf until recently, watch tv    ROS--See HPI   Objective: BP 150/80  Pulse 56  Temp(Src) 97.9 F (36.6 C)  Wt 161 lb (73.029 kg) Gen:  NAD, resting comfortably HEENT: Mucous membranes are moist. Oropharynx normal Neck: no thyromegaly CV: irregular heart beat, seems to be related to breathing pattern, no murmurs Lungs: CTAB no crackles, wheeze, rhonchi Ext: no edema Skin: multiple erythematous scaly lesions on scalp consistent with actinic keratosis .  EKG: Sinus bradycardia with rhythm with variable rate, no nonconducted p waves noted so no signs of heartblock and pr interval nromal, left axis deviation. q waves in v2, v3 unchanged from prior. <1 block st elevation noted.   Assessment/Plan:  DEPRESSION Patient denies history of depression and has never been on medication to his recollection.   HYPERLIPIDEMIA Last LDL <120. Patient no history CVA or MI or CAD. Will continue without medication. Discussed risk of MI or CVA vs. Risk of statin and patient opted to not take.   HYPERTENSION Systolic and diastolic essentially at goal given age. No new agent at this time.   Sinus bradycardia 1st degree av block with pr 204 but asymptomatic. EKG with q waves but patient with no history MI known. Bradycardia also noted and seems to vary with breaths. Wanted to rule out a fib with EKG and no evidence of this. No further workup at this time.   Actinic keratosis On scalp. Offered for patient to return for cryotherapy but he opts to have this completed by his dermatologist who he will call to schedule an appointment with.

## 2013-11-11 NOTE — Assessment & Plan Note (Addendum)
Last LDL <120. Patient no history CVA or MI or CAD. Will continue without medication. Discussed risk of MI or CVA vs. Risk of statin and patient opted to not take.

## 2013-11-11 NOTE — Assessment & Plan Note (Signed)
Patient denies history of depression and has never been on medication to his recollection.

## 2013-11-11 NOTE — Assessment & Plan Note (Signed)
On scalp. Offered for patient to return for cryotherapy but he opts to have this completed by his dermatologist who he will call to schedule an appointment with.

## 2013-11-11 NOTE — Assessment & Plan Note (Addendum)
1st degree av block with pr 204 but asymptomatic. EKG with q waves but patient with no history MI known. Bradycardia also noted and seems to vary with breaths. Wanted to rule out a fib with EKG and no evidence of this. No further workup at this time.

## 2013-11-11 NOTE — Patient Instructions (Addendum)
Wonderful to meet you and get to know your story and history Your irregular heart beat is not due to atrial fibrillation. Let me know if you ever have palpitations or shortness of breath or chest pain.  Since it has been a while since you have seen your dermatologist and it seems you have some precancerous lesions, recommend going back to see your skin doctor  See me every 6 months or so. We will check labs at that time.

## 2013-11-11 NOTE — Assessment & Plan Note (Signed)
Systolic and diastolic essentially at goal given age. No new agent at this time.

## 2013-12-12 ENCOUNTER — Ambulatory Visit: Payer: Medicare Other | Admitting: Family Medicine

## 2013-12-16 ENCOUNTER — Other Ambulatory Visit: Payer: Self-pay | Admitting: Dermatology

## 2014-01-13 ENCOUNTER — Emergency Department (HOSPITAL_BASED_OUTPATIENT_CLINIC_OR_DEPARTMENT_OTHER)
Admission: EM | Admit: 2014-01-13 | Discharge: 2014-01-13 | Disposition: A | Discharge status: Home / Self Care | Payer: Medicare Other | Attending: Emergency Medicine | Admitting: Emergency Medicine

## 2014-01-13 ENCOUNTER — Emergency Department (HOSPITAL_BASED_OUTPATIENT_CLINIC_OR_DEPARTMENT_OTHER): Payer: Medicare Other

## 2014-01-13 ENCOUNTER — Encounter (HOSPITAL_BASED_OUTPATIENT_CLINIC_OR_DEPARTMENT_OTHER): Payer: Self-pay | Admitting: Emergency Medicine

## 2014-01-13 DIAGNOSIS — F329 Major depressive disorder, single episode, unspecified: Secondary | ICD-10-CM | POA: Diagnosis not present

## 2014-01-13 DIAGNOSIS — S42201A Unspecified fracture of upper end of right humerus, initial encounter for closed fracture: Secondary | ICD-10-CM

## 2014-01-13 DIAGNOSIS — I10 Essential (primary) hypertension: Secondary | ICD-10-CM | POA: Insufficient documentation

## 2014-01-13 DIAGNOSIS — E785 Hyperlipidemia, unspecified: Secondary | ICD-10-CM | POA: Insufficient documentation

## 2014-01-13 DIAGNOSIS — Z8546 Personal history of malignant neoplasm of prostate: Secondary | ICD-10-CM | POA: Insufficient documentation

## 2014-01-13 DIAGNOSIS — M1990 Unspecified osteoarthritis, unspecified site: Secondary | ICD-10-CM | POA: Insufficient documentation

## 2014-01-13 DIAGNOSIS — W010XXA Fall on same level from slipping, tripping and stumbling without subsequent striking against object, initial encounter: Secondary | ICD-10-CM | POA: Insufficient documentation

## 2014-01-13 DIAGNOSIS — Z87891 Personal history of nicotine dependence: Secondary | ICD-10-CM | POA: Diagnosis not present

## 2014-01-13 DIAGNOSIS — S4991XA Unspecified injury of right shoulder and upper arm, initial encounter: Secondary | ICD-10-CM | POA: Diagnosis present

## 2014-01-13 DIAGNOSIS — Z79899 Other long term (current) drug therapy: Secondary | ICD-10-CM | POA: Diagnosis not present

## 2014-01-13 DIAGNOSIS — H409 Unspecified glaucoma: Secondary | ICD-10-CM | POA: Diagnosis not present

## 2014-01-13 MED ORDER — HYDROCODONE-ACETAMINOPHEN 5-325 MG PO TABS
1.0000 | ORAL_TABLET | Freq: Four times a day (QID) | ORAL | Status: DC | PRN
Start: 2014-01-13 — End: 2014-04-13

## 2014-01-13 NOTE — ED Provider Notes (Signed)
CSN: [id number]     Arrival date & time 01/13/14  1302 History   First MD Initiated Contact with Patient 01/13/14 1316     Chief Complaint  Patient presents with  . Fall     (Consider location/radiation/quality/duration/timing/severity/associated sxs/prior Treatment) HPI Comments: Patient presents with right shoulder pain after tripping and falling on carpet. He denies any other injury.  Patient is a 80 y.o. male presenting with fall. The history is provided by the patient.  Fall This is a new problem. The current episode started 1 to 2 hours ago. The problem occurs constantly. The problem has not changed since onset.Pertinent negatives include no chest pain and no headaches. Exacerbated by: Movement and palpation. Nothing relieves the symptoms. He has tried nothing for the symptoms. The treatment provided no relief.    Past Medical History  Diagnosis Date  . HYPERLIPIDEMIA 01/02/2007  . DEPRESSION 01/02/2007    patient denies history  . GLAUCOMA NOS 01/03/2007  . CERUMEN IMPACTION 09/01/2008  . ACUT SUPPRATV OTITIS MEDIA W/O SPONT RUP EARDRUM 09/01/2008    patient denies rupture  . HYPERTENSION 10/30/2007  . VENOUS INSUFFICIENCY, CHRONIC 01/03/2007  . ANKLE INJURY 02/28/2010  . PROSTATE CANCER, HX OF 01/02/2007  . DJD (degenerative joint disease)    Past Surgical History  Procedure Laterality Date  . Colonoscopy    . Lymph node removal      associated with prostate cancer   Family History  Problem Relation Age of Onset  . Alcohol abuse Other   . Cancer Other 50    relative  . Stroke Other 50    relative  . Prostate cancer Other     1st degree relative <50   History  Substance Use Topics  . Smoking status: Former Smoker -- 1.00 packs/day for 19 years    Types: Cigarettes    Quit date: 10/04/1957  . Smokeless tobacco: Never Used  . Alcohol Use: No    Review of Systems  Cardiovascular: Negative for chest pain.  Neurological: Negative for headaches.  All other systems  reviewed and are negative.     Allergies  Review of patient's allergies indicates no known allergies.  Home Medications   Prior to Admission medications   Medication Sig Start Date End Date Taking? Authorizing Provider  carteolol (OCUPRESS) 1 % ophthalmic solution Place 0.5 mLs into both eyes 2 (two) times daily.     Historical Provider, MD  glucosamine-chondroitin 500-400 MG tablet Take 1 tablet by mouth daily.    Historical Provider, MD  latanoprost (XALATAN) 0.005 % ophthalmic solution Place 1 drop into both eyes at bedtime.    Historical Provider, MD  Omega-3 Fatty Acids (FISH OIL CONCENTRATE) 1000 MG CAPS Take 1,000 mg by mouth daily.     Historical Provider, MD  vitamin C (ASCORBIC ACID) 500 MG tablet Take 1,000 mg by mouth daily.    Historical Provider, MD   BP 153/107  Pulse 60  Resp 16  Ht 5\' 9"  (1.753 m)  Wt 165 lb (74.844 kg)  BMI 24.36 kg/m2  SpO2 97% Physical Exam  Nursing note and vitals reviewed. Constitutional: He is oriented to person, place, and time. He appears well-developed and well-nourished. No distress.  HENT:  Head: Normocephalic and atraumatic.  Mouth/Throat: Oropharynx is clear and moist.  Neck: Normal range of motion. Neck supple.  Musculoskeletal: Normal range of motion.  The right shoulder has tenderness to palpation over the lateral aspect. Distal ulnar and radial pulses are palpable. He is able  to flex, extend, and move all fingers. Sensation is intact to the entire hand.  Neurological: He is alert and oriented to person, place, and time.  Skin: Skin is warm and dry. He is not diaphoretic.    ED Course  Procedures (including critical care time) Labs Review Labs Reviewed - No data to display  Imaging Review Dg Shoulder Right  01/13/2014   CLINICAL DATA:  Fall today  EXAM: RIGHT SHOULDER - 2+ VIEW  COMPARISON:  None.  FINDINGS: Comminuted fracture proximal humerus involving the humeral head. There is avulsion of the greater tuberosity.  Fracture fragments are minimally displaced.  Glenohumeral degenerative change.  AC joint intact.  IMPRESSION: Mildly displaced fracture proximal humerus and greater tuberosity.   Electronically Signed   By: Franchot Gallo M.D.   On: 01/13/2014 13:34     EKG Interpretation None      MDM   Final diagnoses:  None    X-rays reveal a proximal humeral fracture. This will be treated with a sling-and-swathe and followup with orthopedics.    Veryl Speak, MD 01/13/14 [phone]

## 2014-01-13 NOTE — ED Notes (Signed)
Pt c/o fall from standing landing on carpet floor injuring right shoulder  X 1 hr ago

## 2014-01-13 NOTE — Discharge Instructions (Signed)
Wear sling as applied until followed up by orthopedics.  Ice for 20 minutes every 2 hours while awake for the next 2 days.  Hydrocodone as prescribed as needed for pain.  Followup with Dr. Erlinda Hong from orthopedic surgery. His contact information has been provided in this discharge summary. You should call to make this appointment for one week from now.   Humerus Fracture, Treated with Immobilization The humerus is the large bone in your upper arm. You have a broken (fractured) humerus. These fractures are easily diagnosed with X-rays. TREATMENT  Simple fractures which will heal without disability are treated with simple immobilization. Immobilization means you will wear a cast, splint, or sling. You have a fracture which will do well with immobilization. The fracture will heal well simply by being held in a good position until it is stable enough to begin range of motion exercises. Do not take part in activities which would further injure your arm.  HOME CARE INSTRUCTIONS   Put ice on the injured area.  Put ice in a plastic bag.  Place a towel between your skin and the bag.  Leave the ice on for 15-20 minutes, 03-04 times a day.  If you have a cast:  Do not scratch the skin under the cast using sharp or pointed objects.  Check the skin around the cast every day. You may put lotion on any red or sore areas.  Keep your cast dry and clean.  If you have a splint:  Wear the splint as directed.  Keep your splint dry and clean.  You may loosen the elastic around the splint if your fingers become numb, tingle, or turn cold or blue.  If you have a sling:  Wear the sling as directed.  Do not put pressure on any part of your cast or splint until it is fully hardened.  Your cast or splint can be protected during bathing with a plastic bag. Do not lower the cast or splint into water.  Only take over-the-counter or prescription medicines for pain, discomfort, or fever as directed by your  caregiver.  Do range of motion exercises as instructed by your caregiver.  Follow up as directed by your caregiver. This is very important in order to avoid permanent injury or disability and chronic pain. SEEK IMMEDIATE MEDICAL CARE IF:   Your skin or nails in the injured arm turn blue or gray.  Your arm feels cold or numb.  You develop severe pain in the injured arm.  You are having problems with the medicines you were given. MAKE SURE YOU:   Understand these instructions.  Will watch your condition.  Will get help right away if you are not doing well or get worse. Document Released: 07/03/2000 Document Revised: 06/19/2011 Document Reviewed: 05/11/2010 [facility] Patient Information 2015 [place], [place]. This information is not intended to replace advice given to you by your health care provider. Make sure you discuss any questions you have with your health care provider.

## 2014-01-17 ENCOUNTER — Encounter (HOSPITAL_BASED_OUTPATIENT_CLINIC_OR_DEPARTMENT_OTHER): Payer: Self-pay | Admitting: Emergency Medicine

## 2014-01-17 ENCOUNTER — Emergency Department (HOSPITAL_BASED_OUTPATIENT_CLINIC_OR_DEPARTMENT_OTHER): Payer: Medicare Other

## 2014-01-17 ENCOUNTER — Emergency Department (HOSPITAL_BASED_OUTPATIENT_CLINIC_OR_DEPARTMENT_OTHER)
Admission: EM | Admit: 2014-01-17 | Discharge: 2014-01-17 | Disposition: A | Discharge status: Home / Self Care | Payer: Medicare Other | Attending: Emergency Medicine | Admitting: Emergency Medicine

## 2014-01-17 DIAGNOSIS — Z8546 Personal history of malignant neoplasm of prostate: Secondary | ICD-10-CM | POA: Insufficient documentation

## 2014-01-17 DIAGNOSIS — I10 Essential (primary) hypertension: Secondary | ICD-10-CM | POA: Diagnosis not present

## 2014-01-17 DIAGNOSIS — Y929 Unspecified place or not applicable: Secondary | ICD-10-CM | POA: Diagnosis not present

## 2014-01-17 DIAGNOSIS — W19XXXA Unspecified fall, initial encounter: Secondary | ICD-10-CM | POA: Diagnosis not present

## 2014-01-17 DIAGNOSIS — S4991XA Unspecified injury of right shoulder and upper arm, initial encounter: Secondary | ICD-10-CM | POA: Insufficient documentation

## 2014-01-17 DIAGNOSIS — F329 Major depressive disorder, single episode, unspecified: Secondary | ICD-10-CM | POA: Diagnosis not present

## 2014-01-17 DIAGNOSIS — Z79899 Other long term (current) drug therapy: Secondary | ICD-10-CM | POA: Insufficient documentation

## 2014-01-17 DIAGNOSIS — S42441A Displaced fracture (avulsion) of medial epicondyle of right humerus, initial encounter for closed fracture: Secondary | ICD-10-CM | POA: Insufficient documentation

## 2014-01-17 DIAGNOSIS — S42201S Unspecified fracture of upper end of right humerus, sequela: Secondary | ICD-10-CM | POA: Insufficient documentation

## 2014-01-17 DIAGNOSIS — Y939 Activity, unspecified: Secondary | ICD-10-CM | POA: Insufficient documentation

## 2014-01-17 DIAGNOSIS — Z87828 Personal history of other (healed) physical injury and trauma: Secondary | ICD-10-CM | POA: Insufficient documentation

## 2014-01-17 DIAGNOSIS — Z87891 Personal history of nicotine dependence: Secondary | ICD-10-CM | POA: Insufficient documentation

## 2014-01-17 DIAGNOSIS — M7989 Other specified soft tissue disorders: Secondary | ICD-10-CM

## 2014-01-17 DIAGNOSIS — E785 Hyperlipidemia, unspecified: Secondary | ICD-10-CM | POA: Diagnosis not present

## 2014-01-17 NOTE — ED Notes (Addendum)
Pt suffered a right humeral head fracture after fall on Thursday and right arm in a sling.  Denies head injury.  Per girlfriend, pt on vicodin and has been convfused, with right arm feeling hot and color change noted.  Color change/bruising noted distal right arm and right arm warm to touch.  Pt denies pain while arm is supported.  Pt took last vicodin around 1100am this morning.  Pt denies taking blood thinners of any kind.

## 2014-01-17 NOTE — ED Notes (Signed)
Pt sling used that he came in with.

## 2014-01-17 NOTE — Discharge Instructions (Signed)
Humerus Fracture, Treated with Immobilization Follow up with Dr. Erlinda Hong as scheduled. Return to the ED if you develop new or worsening symptoms. The humerus is the large bone in your upper arm. You have a broken (fractured) humerus. These fractures are easily diagnosed with X-rays. TREATMENT  Simple fractures which will heal without disability are treated with simple immobilization. Immobilization means you will wear a cast, splint, or sling. You have a fracture which will do well with immobilization. The fracture will heal well simply by being held in a good position until it is stable enough to begin range of motion exercises. Do not take part in activities which would further injure your arm.  HOME CARE INSTRUCTIONS   Put ice on the injured area.  Put ice in a plastic bag.  Place a towel between your skin and the bag.  Leave the ice on for 15-20 minutes, 03-04 times a day.  If you have a cast:  Do not scratch the skin under the cast using sharp or pointed objects.  Check the skin around the cast every day. You may put lotion on any red or sore areas.  Keep your cast dry and clean.  If you have a splint:  Wear the splint as directed.  Keep your splint dry and clean.  You may loosen the elastic around the splint if your fingers become numb, tingle, or turn cold or blue.  If you have a sling:  Wear the sling as directed.  Do not put pressure on any part of your cast or splint until it is fully hardened.  Your cast or splint can be protected during bathing with a plastic bag. Do not lower the cast or splint into water.  Only take over-the-counter or prescription medicines for pain, discomfort, or fever as directed by your caregiver.  Do range of motion exercises as instructed by your caregiver.  Follow up as directed by your caregiver. This is very important in order to avoid permanent injury or disability and chronic pain. SEEK IMMEDIATE MEDICAL CARE IF:   Your skin or nails  in the injured arm turn blue or gray.  Your arm feels cold or numb.  You develop severe pain in the injured arm.  You are having problems with the medicines you were given. MAKE SURE YOU:   Understand these instructions.  Will watch your condition.  Will get help right away if you are not doing well or get worse. Document Released: 07/03/2000 Document Revised: 06/19/2011 Document Reviewed: 05/11/2010 [facility] Patient Information 2015 [place], [place]. This information is not intended to replace advice given to you by your health care provider. Make sure you discuss any questions you have with your health care provider.

## 2014-01-17 NOTE — ED Provider Notes (Signed)
CSN: [id number]     Arrival date & time 01/17/14  1127 History  This chart was scribed for Hector Essex, MD by Ludger Nutting, ED Scribe. This patient was seen in room MH09/MH09 and the patient's care was started 12:04 PM.    Chief Complaint  Patient presents with  . Arm Injury   The history is provided by the patient and a relative. No language interpreter was used.    HPI Comments: Hector Pratt is a 80 y.o. male who presents to the Emergency Department complaining of constant, increased pain and bruising to the RUE that began 2 days ago after a fall. Patient was seen on 01/13/14 and was diagnosed with a right humeral fracture. He was prescribed Vicodin, given a sling, and advised to follow up with orthopedics. Patient denies having another fall or re-injury of any kind. He denies right arm numbness or tingling, lower extremity pain.   Past Medical History  Diagnosis Date  . HYPERLIPIDEMIA 01/02/2007  . DEPRESSION 01/02/2007    patient denies history  . GLAUCOMA NOS 01/03/2007  . CERUMEN IMPACTION 09/01/2008  . ACUT SUPPRATV OTITIS MEDIA W/O SPONT RUP EARDRUM 09/01/2008    patient denies rupture  . HYPERTENSION 10/30/2007  . VENOUS INSUFFICIENCY, CHRONIC 01/03/2007  . ANKLE INJURY 02/28/2010  . PROSTATE CANCER, HX OF 01/02/2007  . DJD (degenerative joint disease)    Past Surgical History  Procedure Laterality Date  . Colonoscopy    . Lymph node removal      associated with prostate cancer   Family History  Problem Relation Age of Onset  . Alcohol abuse Other   . Cancer Other 50    relative  . Stroke Other 50    relative  . Prostate cancer Other     1st degree relative <50   History  Substance Use Topics  . Smoking status: Former Smoker -- 1.00 packs/day for 19 years    Types: Cigarettes    Quit date: 10/04/1957  . Smokeless tobacco: Never Used  . Alcohol Use: No    Review of Systems  A complete 10 system review of systems was obtained and all systems are negative  except as noted in the HPI and PMH.    Allergies  Review of patient's allergies indicates no known allergies.  Home Medications   Prior to Admission medications   Medication Sig Start Date End Date Taking? Authorizing Provider  carteolol (OCUPRESS) 1 % ophthalmic solution Place 0.5 mLs into both eyes 2 (two) times daily.     Historical Provider, MD  glucosamine-chondroitin 500-400 MG tablet Take 1 tablet by mouth daily.    Historical Provider, MD  HYDROcodone-acetaminophen (NORCO) 5-325 MG per tablet Take 1-2 tablets by mouth every 6 (six) hours as needed. 01/13/14   Veryl Speak, MD  latanoprost (XALATAN) 0.005 % ophthalmic solution Place 1 drop into both eyes at bedtime.    Historical Provider, MD  Omega-3 Fatty Acids (FISH OIL CONCENTRATE) 1000 MG CAPS Take 1,000 mg by mouth daily.     Historical Provider, MD  vitamin C (ASCORBIC ACID) 500 MG tablet Take 1,000 mg by mouth daily.    Historical Provider, MD   BP 167/69  Pulse 64  Temp(Src) 98.4 F (36.9 C) (Rectal)  Resp 18  Ht 5\' 9"  (1.753 m)  Wt 165 lb (74.844 kg)  BMI 24.36 kg/m2  SpO2 95% Physical Exam  Nursing note and vitals reviewed. Constitutional: He is oriented to person, place, and time. He appears well-developed and  well-nourished. No distress.  HENT:  Head: Normocephalic and atraumatic.  Mouth/Throat: Oropharynx is clear and moist. No oropharyngeal exudate.  Bruising to upper lip  Eyes: Conjunctivae and EOM are normal. Pupils are equal, round, and reactive to light.  Neck: Normal range of motion. Neck supple.  No meningismus.  Cardiovascular: Normal rate, regular rhythm, normal heart sounds and intact distal pulses.   No murmur heard. Pulmonary/Chest: Effort normal and breath sounds normal. No respiratory distress.  Abdominal: Soft. There is no tenderness. There is no rebound and no guarding.  Musculoskeletal: He exhibits tenderness. He exhibits no edema.  Extensive ecchymosis and purple discoloration from mid  humerus distally. Intact radial pulse. Cardinal hand movements intact. Unable to extend 5th digit at baseline. Full ROM of elbow and wrist. Reduced ROM right shoulder. Right chest wall that is non tender. No spine pain.   Neurological: He is alert and oriented to person, place, and time. No cranial nerve deficit. He exhibits normal muscle tone. Coordination normal.  No ataxia on finger to nose bilaterally. No pronator drift. 5/5 strength throughout. CN 2-12 intact. Negative Romberg. Equal grip strength. Sensation intact. Gait is normal.   Skin: Skin is warm. Ecchymosis noted.  Psychiatric: He has a normal mood and affect. His behavior is normal.    ED Course  Procedures (including critical care time)  DIAGNOSTIC STUDIES: Oxygen Saturation is 99% on RA, normal by my interpretation.    COORDINATION OF CARE: 12:11 PM Discussed treatment plan with pt at bedside and pt agreed to plan.   Labs Review Labs Reviewed - No data to display  Imaging Review Dg Chest 2 View  01/17/2014   CLINICAL DATA:  [place] 01/13/2014 sustaining a right humeral head and neck fracture. Patient presents today with bruising and erythema involving the entire right upper extremity. No new injuries. Subsequent evaluation.  EXAM: CHEST  2 VIEW  COMPARISON:  11/01/2011.  FINDINGS: Cardiac silhouette mildly enlarged, accentuated by the pectus excavatum sternal deformity, but has increased in size since the prior examination. Thoracic aorta tortuous in a atherosclerotic, unchanged. Possible small hiatal hernia. Hilar and mediastinal contours otherwise unremarkable. Mildly prominent bronchovascular markings diffusely and mild central peribronchial thickening, unchanged. Lungs otherwise clear. No localized airspace consolidation. No pleural effusions. No pneumothorax. Normal pulmonary vascularity. Degenerative changes involving the thoracic spine.  IMPRESSION: 1. Stable mild changes of chronic bronchitis and/or asthma. No acute  cardiopulmonary disease. 2. Mild cardiomegaly, with interval slight increase in heart size since 2013. 3. Possible small hiatal hernia.   Electronically Signed   By: Evangeline Dakin M.D.   On: 01/17/2014 13:45   Dg Shoulder Right  01/17/2014   CLINICAL DATA:  Known wound comminuted humeral head fracture with pain and redness  EXAM: RIGHT SHOULDER - 2+ VIEW  COMPARISON:  January 13, 2014  FINDINGS: Frontal and Y scapular images were obtained. There is again noted a comminuted fracture of the proximal right humerus with avulsion of the greater tuberosity. There is a fracture extending through the proximal humeral metaphysis as well as several fractures in the greater tuberosity region. There is a fracture of the mid glenoid region as well in near anatomic alignment. No dislocation. There is moderate osteoarthritic change.  IMPRESSION: Comminuted fracture of the proximal humerus with avulsion of the greater trochanter. There is also a fracture in the mid glenoid region. No dislocation. Moderate osteoarthritic change.   Electronically Signed   By: Lowella Grip M.D.   On: 01/17/2014 13:46   Dg Elbow Complete  Right  01/17/2014   CLINICAL DATA:  Pain post trauma ; patient fell 4 days previously  EXAM: RIGHT ELBOW - COMPLETE 3+ VIEW  COMPARISON:  None.  FINDINGS: Frontal, lateral, bilateral oblique views were obtained. A tiny calcification is noted adjacent to the distal medial humeral metaphysis, likely representing a small avulsion from this area. No other evidence of fracture. No dislocation. No effusion. There is mild joint space narrowing. No erosive change.  IMPRESSION: Evidence suggesting small avulsion arising from the distal medial humeral metaphysis. No effusion. No dislocation. Mild osteoarthritic change.   Electronically Signed   By: Lowella Grip M.D.   On: 01/17/2014 13:48   Dg Forearm Right  01/17/2014   CLINICAL DATA:  Fall 4 days ago. Increased right forearm bruising and swelling.  Subsequent encounter.  EXAM: RIGHT FOREARM - 2 VIEW  COMPARISON:  None.  FINDINGS: There is no evidence of fracture or other focal bone lesions. Mild peripheral vascular calcification is seen in the distal forearm.  IMPRESSION: No acute findings.   Electronically Signed   By: Earle Gell M.D.   On: 01/17/2014 13:51   Dg Wrist Complete Right  01/17/2014   CLINICAL DATA:  Fall 4 days ago with right humeral head fracture. Increased bruising and erythema of the right upper extremity.  EXAM: RIGHT WRIST - COMPLETE 3+ VIEW  COMPARISON:  None.  FINDINGS: There is no evidence of fracture or dislocation. Mild degenerative changes are seen at the first [facility] joint. Soft tissues are unremarkable.  IMPRESSION: No evidence of acute fracture.   Electronically Signed   By: Aletta Edouard M.D.   On: 01/17/2014 13:57   US Venous Img Upper Uni Right  01/17/2014   CLINICAL DATA:  Humeral head fracture 4 days ago with upper extremity edema  EXAM: RIGHT UPPER EXTREMITY VENOUS DUPLEX ULTRASOUND  TECHNIQUE: Gray-scale sonography with graded compression, as well as color Doppler and duplex ultrasound were performed to evaluate the upper extremity deep venous system from the level of the subclavian vein and including the jugular, axillary, basilic, radial, ulnar and upper cephalic vein. Spectral Doppler was utilized to evaluate flow at rest and with distal augmentation maneuvers.  COMPARISON:  None.  FINDINGS: Internal Jugular Vein: No evidence of thrombus. Normal compressibility, respiratory phasicity and response to augmentation.  Subclavian Vein: No evidence of thrombus. Normal compressibility, respiratory phasicity and response to augmentation.  Axillary Vein: No evidence of thrombus. Normal compressibility, respiratory phasicity and response to augmentation.  Cephalic Vein: No evidence of thrombus. Normal compressibility, respiratory phasicity and response to augmentation.  Basilic Vein: No evidence of thrombus. Normal  compressibility, respiratory phasicity and response to augmentation.  Brachial Veins: No evidence of thrombus. Normal compressibility, respiratory phasicity and response to augmentation.  Radial Veins: No evidence of thrombus. Normal compressibility, respiratory phasicity and response to augmentation.  Ulnar Veins: Unable to visualize.  Venous Reflux:  None visualized.  Other Findings:  None visualized.  IMPRESSION: No evidence of right upper extremity deep or superficial venous thrombosis. Note that the right ulnar vein could not be visualized, likely due to hand edema.   Electronically Signed   By: Lowella Grip M.D.   On: 01/17/2014 13:17   Dg Humerus Right  01/17/2014   CLINICAL DATA:  Pain post trauma 4 days prior  EXAM: RIGHT HUMERUS - 2+ VIEW  COMPARISON:  Right shoulder January 13, 2014  FINDINGS: Frontal and lateral views were obtained. There is a comminuted fracture of the proximal humeral metaphysis and greater  tuberosity, with major fracture fragments in near anatomic alignment. A small avulsion arising from the distal humeral metaphysis medial is seen on elbow images but not on this study. No dislocations. No abnormal periosteal reaction.  IMPRESSION: Comminuted fracture proximal humerus.  No dislocations.   Electronically Signed   By: Lowella Grip M.D.   On: 01/17/2014 13:51     EKG Interpretation None      MDM   Final diagnoses:  Arm swelling  Proximal humerus fracture, right, sequela  Fracture of medial epicondyle of humerus, right, closed, initial encounter   worsening pain in right arm and shoulder after a fall 4 days ago. Found to have proximal humerus fracture. Returns with worsening swelling and ecchymosis to the arm.  Denies any new trauma.  Extensive ecchymosis and swelling from mid humerus all the way down arm including right chest wall.  X-ray shows stable proximal humerus fracture as well as glenoid fracture and distal medial avulsion of  humerus. Neurovascular intact. No evidence of DVT.  D/w Dr. Lorin Mercy, on call for Dr. Erlinda Hong.  Recommends long arm posterior splint and sling.  Follow up with Dr. Erlinda Hong as scheduled.  Patient has enough pain medication.  I personally performed the services described in this documentation, which was scribed in my presence. The recorded information has been reviewed and is accurate.   Hector Essex, MD 01/17/14 1800

## 2014-03-30 ENCOUNTER — Telehealth: Payer: Self-pay

## 2014-03-30 NOTE — Telephone Encounter (Signed)
[facility] - Client [place] [facility] Patient Name: Hector Pratt Gender: Male DOB: [birth year] Age: 80 Y 88 M 5 D Return Phone Number: [phone] (Primary) Address: City/State/Zip: [place] Client [facility] - Client Client Site [facility] - Night Physician [name] Contact Type Call Call Type Triage / Clinical Caller Name Elenor Relationship To Patient Spouse Return Phone Number [phone] (Primary) Chief Complaint Sore Throat Initial Comment Caller states her husband has a sore throat. PreDisposition Call Doctor Nurse Assessment Nurse: Dimas Chyle, RN, Dellis Filbert Date/Time Eilene Ghazi Time): 03/28/2014 9:29:44 AM Confirm and document reason for call. If symptomatic, describe symptoms. ---Caller states her husband has a sore throat. No fever. No redness or white patches. Has the patient traveled out of the country within the last 30 days? ---Not Applicable Does the patient require triage? ---Yes Related visit to physician within the last 2 weeks? ---No Does the PT have any chronic conditions? (i.e. diabetes, asthma, etc.) ---No Guidelines Guideline Title Affirmed Question Affirmed Notes Nurse Date/Time (Eastern Time) Sore Throat [1] Sore throat is the only symptom AND [2] sore throat present < 48 hours (all triage questions negative) Dimas Chyle, RN, Dellis Filbert 03/28/2014 9:31:14 AM Disp. Time Eilene Ghazi Time) Disposition Final User 03/28/2014 9:34:17 AM Home Care Yes Dimas Chyle, RN, Guerry Minors Understands: Yes Disagree/Comply: Comply Care Advice Given Per Guideline PLEASE NOTE: All timestamps contained within this report are represented as [place] Standard Time. CONFIDENTIALTY NOTICE: This fax transmission is intended only for the addressee. It contains information that is legally privileged, confidential or otherwise protected from use or disclosure. If you are  not the intended recipient, you are strictly prohibited from reviewing, disclosing, copying using or disseminating any of this information or taking any action in reliance on or regarding this information. If you have received this fax in error, please notify us immediately by telephone so that we can arrange for its return to [place]. Phone: [phone], Toll-Free: [phone], Fax: [phone] Page: 2 of 2 Call Id: [id number] Care Advice Given Per Guideline HOME CARE: You should be able to treat this at home. REASSURANCE: * Most mild sore throats and intermittent sore throats are just part of a cold and can be treated at home. * The presence of a cough, hoarseness or nasal symptoms points to a viral infection as the cause of the sore throat. SORE THROAT - For relief of sore throat: * Sip warm chicken broth or apple juice. * Suck on hard candy or a throat lozenge (OTC). * Gargle with warm salt water four times a day. To make salt water, put 1/2 teaspoon of salt in 8 oz (240 ml) of warm water. PAIN OR FEVER MEDICINES: ACETAMINOPHEN (E.G., TYLENOL): * Take 650 mg (two 325 mg pills) by mouth every 4-6 hours as needed. Each Regular Strength Tylenol pill has 325 mg of acetaminophen. The most you should take each day is 3,250 mg (10 Regular Strength pills a day). * Another choice is to take 1,000 mg (two 500 mg pills) every 8 hours as needed. Each Extra Strength Tylenol pill has 500 mg of acetaminophen. The most you should take each day is 3,000 mg (6 Extra Strength pills a day). SOFT DIET: * Eat a soft diet. Cold drinks, popsicles, and milk shakes are especially good. Avoid citrus fruits. * Drink plenty of liquids so as to avoid dehydration (8-12 eight oz glasses each day). NO ANTIBIOTICS: Antibiotics are not helpful for viral sore throats. EXPECTED COURSE:  Sore throats with viral illnesses usually last 3 or 4 days. CONTAGIOUSNESS: You can return to work after the fever is gone and you feel well enough to  participate in normal activities. CALL BACK IF: * Fever lasts over 3 days * Sore throat with cold symptoms, and sore throat lasts over 5 days * Sore throat is the only symptom AND lasts over 48 hours CARE ADVICE given per Sore Throat (Adult) guideline. * You become worse

## 2014-03-30 NOTE — Telephone Encounter (Signed)
FYI

## 2014-04-13 ENCOUNTER — Ambulatory Visit (INDEPENDENT_AMBULATORY_CARE_PROVIDER_SITE_OTHER): Payer: Medicare Other | Admitting: Family Medicine

## 2014-04-13 ENCOUNTER — Encounter: Payer: Self-pay | Admitting: Family Medicine

## 2014-04-13 VITALS — BP 110/68 | HR 56 | Temp 97.7°F | Wt 160.0 lb

## 2014-04-13 DIAGNOSIS — E785 Hyperlipidemia, unspecified: Secondary | ICD-10-CM

## 2014-04-13 DIAGNOSIS — I872 Venous insufficiency (chronic) (peripheral): Secondary | ICD-10-CM | POA: Diagnosis not present

## 2014-04-13 DIAGNOSIS — R001 Bradycardia, unspecified: Secondary | ICD-10-CM | POA: Diagnosis not present

## 2014-04-13 LAB — COMPREHENSIVE METABOLIC PANEL
ALK PHOS: 68 U/L (ref 39–117)
ALT: 17 U/L (ref 0–53)
AST: 16 U/L (ref 0–37)
Albumin: 3.7 g/dL (ref 3.5–5.2)
BILIRUBIN TOTAL: 0.5 mg/dL (ref 0.2–1.2)
BUN: 26 mg/dL — AB (ref 6–23)
CO2: 30 mEq/L (ref 19–32)
CREATININE: 0.9 mg/dL (ref 0.4–1.5)
Calcium: 9 mg/dL (ref 8.4–10.5)
Chloride: 103 mEq/L (ref 96–112)
GFR: 80.74 mL/min (ref >60.00)
Glucose, Bld: 82 mg/dL (ref 70–99)
Potassium: 4.5 mEq/L (ref 3.5–5.1)
Sodium: 141 mEq/L (ref 135–145)
Total Protein: 7 g/dL (ref 6.0–8.3)

## 2014-04-13 LAB — LIPID PANEL
CHOL/HDL RATIO: 4
CHOLESTEROL: 198 mg/dL (ref 0–200)
HDL: 52.8 mg/dL (ref >39.00)
LDL Cholesterol: 128 mg/dL — ABNORMAL HIGH (ref 0–99)
NONHDL: 145.2
Triglycerides: 85 mg/dL (ref 0.0–149.0)
VLDL: 17 mg/dL (ref 0.0–40.0)

## 2014-04-13 LAB — CBC
HEMATOCRIT: 40.3 % (ref 39.0–52.0)
Hemoglobin: 13.5 g/dL (ref 13.0–17.0)
MCHC: 33.5 g/dL (ref 30.0–36.0)
MCV: 97.6 fl (ref 78.0–100.0)
Platelets: 310 10*3/uL (ref 150.0–400.0)
RBC: 4.13 Mil/uL — ABNORMAL LOW (ref 4.22–5.81)
RDW: 13.5 % (ref 11.5–15.5)
WBC: 6.2 10*3/uL (ref 4.0–10.5)

## 2014-04-13 LAB — TSH: TSH: 2.71 u[IU]/mL (ref 0.35–4.50)

## 2014-04-13 NOTE — Assessment & Plan Note (Signed)
Asymptomatic. 1st degree block. Continue to monitor.

## 2014-04-13 NOTE — Progress Notes (Signed)
  [name], MD Phone: [phone]  Subjective:   Hector Pratt is a 81 y.o. year old very pleasant male patient who presents with the following:  Venous insufficiency Wears compression stockings only intermittently. Stable swelling that does worsen longer he keeps stockings off. R leg has always been more swollen than L.  ROS- no shortness of breath, no worsening swelling.   Bradycardia-Stable First degree AV block discovered last visit.  ROs-no lightheadedness or dizziness  Hyperlipidemia-stable No history MI or CVA.  Not on ASA for primary prevention ROS-no chest pain or shortness of breath.   Past Medical History- Patient Active Problem List   Diagnosis Date Noted  . Bradycardia 11/11/2013    Priority: Medium  . HYPERTENSION 10/30/2007    Priority: Medium  . Hyperlipidemia 01/02/2007    Priority: Medium  . Actinic keratosis 11/11/2013    Priority: Low  . Slow transit constipation 11/04/2010    Priority: Low  . GLAUCOMA NOS 01/03/2007    Priority: Low  . Venous (peripheral) insufficiency 01/03/2007    Priority: Low  . PROSTATE CANCER, HX OF 01/02/2007    Priority: Low   Medications- reviewed and updated Current Outpatient Prescriptions  Medication Sig Dispense Refill  . carteolol (OCUPRESS) 1 % ophthalmic solution Place 0.5 mLs into both eyes 2 (two) times daily.     Marland Kitchen glucosamine-chondroitin 500-400 MG tablet Take 1 tablet by mouth daily.    Marland Kitchen latanoprost (XALATAN) 0.005 % ophthalmic solution Place 1 drop into both eyes at bedtime.    . Omega-3 Fatty Acids (FISH OIL CONCENTRATE) 1000 MG CAPS Take 1,000 mg by mouth daily.     . vitamin C (ASCORBIC ACID) 500 MG tablet Take 1,000 mg by mouth daily.       Objective: BP 110/68 mmHg  Pulse 56  Temp(Src) 97.7 F (36.5 C)  Wt 160 lb (72.576 kg) Gen: NAD, resting comfortably, appears younger than stated age CV: slightly bradycardic, regular rate, no murmurs rubs or gallops Lungs: CTAB no crackles,  wheeze, rhonchi Abdomen: soft/nontender/nondistended/normal bowel sounds.  Ext: 1+ edema R>L (has compression stockings but not wearing) no calf pain Skin: warm, dry Neuro: grossly normal, moves all extremities, slight contracture of right 4th and 5th fingers (chronic)   Assessment/Plan:  Bradycardia Asymptomatic. 1st degree block. Continue to monitor.   Hyperlipidemia Mild poor control LDL. No clear indication for primary prevention in age group. Continue off statin and aspirin. Risks likely outweigh benefits for primary prevention.   Venous (peripheral) insufficiency Advised daily use of compression stockings. Stable.    Return precautions advised. 3-4 month follow up. Patient has had shingles twice in past latest 4 years ago. He is going to consider vaccine.   NOT fasting- so LDL may be even lower and technically could be at goal but suspect mild poor control regardless.  Orders Placed This Encounter  Procedures  . CBC    [place]  . Comprehensive metabolic panel    [place]    Order Specific Question:  Has the patient fasted?    Answer:  No  . Lipid panel    [place]    Order Specific Question:  Has the patient fasted?    Answer:  No  . TSH

## 2014-04-13 NOTE — Patient Instructions (Addendum)
Update your labs today  See you in 3-4 months  Health Maintenance Due  Topic Date Due  . ZOSTAVAX -check with [facility] [employer] if you want to get the shingles vaccine [birth year]/1982

## 2014-04-13 NOTE — Assessment & Plan Note (Signed)
Advised daily use of compression stockings. Stable.

## 2014-04-13 NOTE — Assessment & Plan Note (Signed)
Mild poor control LDL. No clear indication for primary prevention in age group. Continue off statin and aspirin. Risks likely outweigh benefits for primary prevention.

## 2014-04-22 DIAGNOSIS — H4011X2 Primary open-angle glaucoma, moderate stage: Secondary | ICD-10-CM | POA: Diagnosis not present

## 2014-05-08 DIAGNOSIS — M25511 Pain in right shoulder: Secondary | ICD-10-CM | POA: Diagnosis not present

## 2014-05-08 DIAGNOSIS — M6281 Muscle weakness (generalized): Secondary | ICD-10-CM | POA: Diagnosis not present

## 2014-05-27 DIAGNOSIS — M25511 Pain in right shoulder: Secondary | ICD-10-CM | POA: Diagnosis not present

## 2014-05-27 DIAGNOSIS — M6281 Muscle weakness (generalized): Secondary | ICD-10-CM | POA: Diagnosis not present

## 2014-08-07 ENCOUNTER — Telehealth: Payer: Self-pay | Admitting: Family Medicine

## 2014-08-07 ENCOUNTER — Encounter: Payer: Self-pay | Admitting: Family Medicine

## 2014-08-07 ENCOUNTER — Ambulatory Visit (INDEPENDENT_AMBULATORY_CARE_PROVIDER_SITE_OTHER): Payer: Medicare Other | Admitting: Family Medicine

## 2014-08-07 VITALS — BP 149/71 | HR 68 | Temp 98.2°F | Ht 69.0 in | Wt 163.0 lb

## 2014-08-07 DIAGNOSIS — A084 Viral intestinal infection, unspecified: Secondary | ICD-10-CM

## 2014-08-07 NOTE — Progress Notes (Signed)
Pre visit review using our clinic review tool, if applicable. No additional management support is needed unless otherwise documented below in the visit note. 

## 2014-08-07 NOTE — Telephone Encounter (Signed)
Patient's wife and daughter wanted to know what needs to be done in order for the patient to get a walker. Can you please call the patient and let them know what are the steps?

## 2014-08-07 NOTE — Progress Notes (Signed)
   Subjective:    Patient ID: Hector Pratt, male    DOB: [birth year], 81 y.o.   MRN: [id number]  HPI Here for 3 days of diarrhea with mild lower abdominal cramps. No nausea or vomiting. No fever. He is drinking water and Gatorade.   Review of Systems  Constitutional: Negative.   Respiratory: Negative.   Cardiovascular: Negative.   Gastrointestinal: Positive for diarrhea. Negative for nausea, vomiting, abdominal pain, constipation, blood in stool, abdominal distention, anal bleeding and rectal pain.  Genitourinary: Negative.        Objective:   Physical Exam  Constitutional: He appears well-developed and well-nourished. No distress.  Cardiovascular: Normal rate, regular rhythm, normal heart sounds and intact distal pulses.   Pulmonary/Chest: Effort normal and breath sounds normal.  Abdominal: Soft. Bowel sounds are normal. He exhibits no distension and no mass. There is no tenderness. There is no rebound and no guarding.          Assessment & Plan:  Probable viral enteritis. Try Imodium AD prn. Recheck prn

## 2014-08-10 NOTE — Telephone Encounter (Signed)
Would pt need a face to face for a wheelchair, or will he just need a Rx?

## 2014-08-10 NOTE — Telephone Encounter (Signed)
They can go to a store like [facility] (and I think [facility] even has them) and I believe purchase one without insurance. If they wanted to come in to see me for an assessment as to need-could write for an rx which may cut down on cost but does not always.

## 2014-08-10 NOTE — Telephone Encounter (Signed)
Called and LM on pt VM tcb

## 2014-08-10 NOTE — Telephone Encounter (Signed)
They did not state if he need a face to face or a just RX. They just wanted to know what they need to do in order for him to get a wheel.

## 2014-08-11 NOTE — Telephone Encounter (Signed)
Called and left another message for pt tcb. 

## 2014-08-28 ENCOUNTER — Ambulatory Visit (INDEPENDENT_AMBULATORY_CARE_PROVIDER_SITE_OTHER): Payer: Medicare Other | Admitting: Family Medicine

## 2014-08-28 ENCOUNTER — Encounter: Payer: Self-pay | Admitting: Family Medicine

## 2014-08-28 VITALS — BP 144/64 | HR 67 | Temp 97.9°F | Wt 163.0 lb

## 2014-08-28 DIAGNOSIS — D485 Neoplasm of uncertain behavior of skin: Secondary | ICD-10-CM

## 2014-08-28 DIAGNOSIS — I872 Venous insufficiency (chronic) (peripheral): Secondary | ICD-10-CM

## 2014-08-28 NOTE — Assessment & Plan Note (Signed)
wells score -1 (edema +1, alternative diagnosis as likely -2). Discussed could do extensive workup including bloodwork and echocardiogram and venous duplex but with known history venous insufficiency with swelling always worse on right, thought low yield. We opted instead to treat with new pair of compression stockings-wrote him an rx for 2 pairs today at [place]. We will reassess when he returns for his procedure.

## 2014-08-28 NOTE — Progress Notes (Signed)
[name], MD  Subjective:  Hector Pratt is a 81 y.o. year old very pleasant male patient who presents with:  Venous insufficiency-worsening Edema -Patient has not used his compression stockings in a long time and thinks he has lost them. He has chronic swelling in legs R >L and this pattern has persisted but worsened.  ROS-Patient denies chest pain, shortness of breath, dyspnea on exertion, orthopnea, PND, recent immobilization, history DVT, active cancer,superficial veins present, swelling of entire leg, localized tenderness in calf paralysis.   Skin Lesion Right upper chest. X 2 months. Seems to be growing, very coarse and firm on top of lesion. Denies history of skin cancer. Has had AK.   Past Medical History- hyperlipidemia, hypertension  Medications- reviewed and updated Current Outpatient Prescriptions  Medication Sig Dispense Refill  . glucosamine-chondroitin 500-400 MG tablet Take 1 tablet by mouth daily.    Marland Kitchen latanoprost (XALATAN) 0.005 % ophthalmic solution Place 1 drop into both eyes at bedtime.    . Multiple Vitamin (MULTIVITAMIN) tablet Take 1 tablet by mouth daily. Centrum    . Omega-3 Fatty Acids (FISH OIL CONCENTRATE) 1000 MG CAPS Take 1,000 mg by mouth daily.     . vitamin C (ASCORBIC ACID) 500 MG tablet Take 1,000 mg by mouth daily.    . carteolol (OCUPRESS) 1 % ophthalmic solution Place 0.5 mLs into both eyes 2 (two) times daily.      No current facility-administered medications for this visit.   Objective: BP 144/64 mmHg  Pulse 67  Temp(Src) 97.9 F (36.6 C)  Wt 163 lb (73.936 kg) Gen: NAD, resting comfortably CV: RRR no murmurs rubs or gallops Lungs: CTAB no crackles, wheeze, rhonchi Abdomen: soft/nontender/nondistended/normal bowel sounds.  Ext: 2+  Edema on right, 1+ on left. 36.5 cm 10 cm below tibial plateau on R and 33.5 cm on L. . No swelling above the knee. No calf tenderness Skin: warm, dry, no rash over legs 4-5 mm circular lesion on  right upper chest with rough basically horned appearance and texture.  Neuro: normal gait   Assessment/Plan:  Venous (peripheral) insufficiency wells score -1 (edema +1, alternative diagnosis as likely -2). Discussed could do extensive workup including bloodwork and echocardiogram and venous duplex but with known history venous insufficiency with swelling always worse on right, thought low yield. We opted instead to treat with new pair of compression stockings-wrote him an rx for 2 pairs today at [place]. We will reassess when he returns for his procedure.    Neoplasm of uncertain behavior of skin Horned lesion. Patient will return for shave biopsy. Will plan on 2-3 mm margins and electrocautery   Emergent Return precautions verbally advised.

## 2014-08-28 NOTE — Assessment & Plan Note (Signed)
Horned lesion. Patient will return for shave biopsy. Will plan on 2-3 mm margins and electrocautery

## 2014-08-28 NOTE — Patient Instructions (Addendum)
I think this swelling is due to your venous insufficiency and not wearing compression stockings. Let's restart them -see order I gave you for [facility].     Let's have you in on June 6th for a 30 minute procedure visit (front desk please convert the 4:30 pm same day slot to a 30 minute procedure visit)

## 2014-09-08 ENCOUNTER — Emergency Department (HOSPITAL_COMMUNITY)
Admission: EM | Admit: 2014-09-08 | Discharge: 2014-09-08 | Disposition: A | Discharge status: Home / Self Care | Payer: Medicare Other | Attending: Emergency Medicine | Admitting: Emergency Medicine

## 2014-09-08 ENCOUNTER — Emergency Department (HOSPITAL_COMMUNITY): Payer: Medicare Other

## 2014-09-08 ENCOUNTER — Telehealth: Payer: Self-pay | Admitting: Family Medicine

## 2014-09-08 ENCOUNTER — Encounter (HOSPITAL_COMMUNITY): Payer: Self-pay | Admitting: *Deleted

## 2014-09-08 DIAGNOSIS — R040 Epistaxis: Secondary | ICD-10-CM | POA: Diagnosis not present

## 2014-09-08 DIAGNOSIS — Z87891 Personal history of nicotine dependence: Secondary | ICD-10-CM | POA: Insufficient documentation

## 2014-09-08 DIAGNOSIS — Z79899 Other long term (current) drug therapy: Secondary | ICD-10-CM | POA: Insufficient documentation

## 2014-09-08 DIAGNOSIS — H409 Unspecified glaucoma: Secondary | ICD-10-CM | POA: Diagnosis not present

## 2014-09-08 DIAGNOSIS — Z8659 Personal history of other mental and behavioral disorders: Secondary | ICD-10-CM | POA: Diagnosis not present

## 2014-09-08 DIAGNOSIS — Y998 Other external cause status: Secondary | ICD-10-CM | POA: Insufficient documentation

## 2014-09-08 DIAGNOSIS — Z8639 Personal history of other endocrine, nutritional and metabolic disease: Secondary | ICD-10-CM | POA: Insufficient documentation

## 2014-09-08 DIAGNOSIS — Z8546 Personal history of malignant neoplasm of prostate: Secondary | ICD-10-CM | POA: Insufficient documentation

## 2014-09-08 DIAGNOSIS — Y9389 Activity, other specified: Secondary | ICD-10-CM | POA: Insufficient documentation

## 2014-09-08 DIAGNOSIS — W1839XA Other fall on same level, initial encounter: Secondary | ICD-10-CM | POA: Insufficient documentation

## 2014-09-08 DIAGNOSIS — S0990XA Unspecified injury of head, initial encounter: Secondary | ICD-10-CM | POA: Insufficient documentation

## 2014-09-08 DIAGNOSIS — I10 Essential (primary) hypertension: Secondary | ICD-10-CM | POA: Insufficient documentation

## 2014-09-08 DIAGNOSIS — Y9289 Other specified places as the place of occurrence of the external cause: Secondary | ICD-10-CM | POA: Diagnosis not present

## 2014-09-08 DIAGNOSIS — G44309 Post-traumatic headache, unspecified, not intractable: Secondary | ICD-10-CM | POA: Diagnosis not present

## 2014-09-08 DIAGNOSIS — S0031XA Abrasion of nose, initial encounter: Secondary | ICD-10-CM | POA: Diagnosis not present

## 2014-09-08 MED ORDER — ACETAMINOPHEN 325 MG PO TABS: 650.0000 mg | ORAL_TABLET | Freq: Once | ORAL | Status: DC

## 2014-09-08 NOTE — ED Notes (Addendum)
Patient fell while in the grocery store. His shoe stuck to the floor as he was walking. Patient fell face first and his glasses mashed across the bridge of his nose. Patient had some nose bleeding that has stopped.

## 2014-09-08 NOTE — ED Notes (Signed)
Patient transported to CT 

## 2014-09-08 NOTE — Telephone Encounter (Signed)
Hector Pratt states the facility advised her the patient is "stumbling over" his words and also need memory exercises.

## 2014-09-08 NOTE — Telephone Encounter (Signed)
Patient is in [facility], [place] and the nurse at the facility suggests the cognitive therapy for speech and memory exercises.

## 2014-09-08 NOTE — Telephone Encounter (Signed)
Kenney Houseman can you get pt to come in to discuss with Dr. Yong Channel?

## 2014-09-08 NOTE — Telephone Encounter (Signed)
Not sure what is meant by cognitive therapy.  Do they mean physical therapy? Would have to have face to face for this but could potentially do on the 6th.  He can certainly pick up a cane and start using, but can also write rx.   Have them remind me when patient comes in.

## 2014-09-08 NOTE — ED Notes (Signed)
Bed: WA06 Expected date:  Expected time:  Means of arrival:  Comments: EMS fall 81 yr old

## 2014-09-08 NOTE — Discharge Instructions (Signed)
If you were given medicines take as directed.  If you are on coumadin or contraceptives realize their levels and effectiveness is altered by many different medicines.  If you have any reaction (rash, tongues swelling, other) to the medicines stop taking and see a physician.    If your blood pressure was elevated in the ER make sure you follow up for management with a primary doctor or return for chest pain, shortness of breath or stroke symptoms.  Please follow up as directed and return to the ER or see a physician for new or worsening symptoms.  Thank you. Filed Vitals:   09/08/14 0932  BP: 188/92  Pulse: 70  Temp: 98 F (36.7 C)  TempSrc: Oral  Resp: 17  SpO2: 95%

## 2014-09-08 NOTE — Telephone Encounter (Signed)
See below

## 2014-09-08 NOTE — Telephone Encounter (Signed)
Patient's daughter Manuela Schwartz would like for Dr. Yong Channel to consider having cognitive therapy and patient to use a cane.  She said patient keeps falling and would benefit from using a cane.

## 2014-09-08 NOTE — ED Provider Notes (Signed)
CSN: [id number]     Arrival date & time 09/08/14  9798 History   First MD Initiated Contact with Patient 09/08/14 0945     Chief Complaint  Patient presents with  . Fall     (Consider location/radiation/quality/duration/timing/severity/associated sxs/prior Treatment) HPI Comments: 81 year old male with high blood pressure, prostate cancer presents after mechanical fall. Patient had his shoe get stuck on recently waxed floor causing him to fall onto his nose and face. No other injury. Patient recalls events, no blood thinners. Bleeding from nose has stopped. Mild tender to palpation nasal bridge. Past smoker.  Patient is a 81 y.o. male presenting with fall. The history is provided by the patient.  Fall Associated symptoms include headaches. Pertinent negatives include no chest pain, no abdominal pain and no shortness of breath.    Past Medical History  Diagnosis Date  . HYPERLIPIDEMIA 01/02/2007  . DEPRESSION 01/02/2007    patient denies history  . GLAUCOMA NOS 01/03/2007  . CERUMEN IMPACTION 09/01/2008  . ACUT SUPPRATV OTITIS MEDIA W/O SPONT RUP EARDRUM 09/01/2008    patient denies rupture  . HYPERTENSION 10/30/2007  . VENOUS INSUFFICIENCY, CHRONIC 01/03/2007  . ANKLE INJURY 02/28/2010  . PROSTATE CANCER, HX OF 01/02/2007  . DJD (degenerative joint disease)    Past Surgical History  Procedure Laterality Date  . Colonoscopy    . Lymph node removal      associated with prostate cancer   Family History  Problem Relation Age of Onset  . Alcohol abuse Other   . Cancer Other 50    relative  . Stroke Other 50    relative  . Prostate cancer Other     1st degree relative <50   History  Substance Use Topics  . Smoking status: Former Smoker -- 1.00 packs/day for 19 years    Types: Cigarettes    Quit date: 10/04/1957  . Smokeless tobacco: Never Used  . Alcohol Use: No    Review of Systems  Constitutional: Negative for fever and chills.  HENT: Positive for nosebleeds. Negative  for congestion.   Eyes: Negative for visual disturbance.  Respiratory: Negative for shortness of breath.   Cardiovascular: Negative for chest pain.  Gastrointestinal: Negative for vomiting and abdominal pain.  Genitourinary: Negative for dysuria and flank pain.  Musculoskeletal: Negative for back pain, neck pain and neck stiffness.  Skin: Positive for wound. Negative for rash.  Neurological: Positive for headaches. Negative for light-headedness.      Allergies  Review of patient's allergies indicates no known allergies.  Home Medications   Prior to Admission medications   Medication Sig Start Date End Date Taking? Authorizing Provider  carteolol (OCUPRESS) 1 % ophthalmic solution Place 0.5 mLs into both eyes 2 (two) times daily.     Historical Provider, MD  glucosamine-chondroitin 500-400 MG tablet Take 1 tablet by mouth daily.    Historical Provider, MD  latanoprost (XALATAN) 0.005 % ophthalmic solution Place 1 drop into both eyes at bedtime.    Historical Provider, MD  Multiple Vitamin (MULTIVITAMIN) tablet Take 1 tablet by mouth daily. Centrum    Historical Provider, MD  Omega-3 Fatty Acids (FISH OIL CONCENTRATE) 1000 MG CAPS Take 1,000 mg by mouth daily.     Historical Provider, MD  vitamin C (ASCORBIC ACID) 500 MG tablet Take 1,000 mg by mouth daily.    Historical Provider, MD   BP 188/92 mmHg  Pulse 70  Temp(Src) 98 F (36.7 C) (Oral)  Resp 17  SpO2 95% Physical Exam  Constitutional: He is oriented to person, place, and time. He appears well-developed and well-nourished.  HENT:  Head: Normocephalic.  Patient has mild tenderness and swelling nasal bridge superficial abrasion. Bleeding from nares controlled dry blood bilateral nares, no hematoma  Eyes: Conjunctivae are normal. Right eye exhibits no discharge. Left eye exhibits no discharge.  Neck: Normal range of motion. Neck supple. No tracheal deviation present.  Cardiovascular: Normal rate and regular rhythm.    Pulmonary/Chest: Effort normal and breath sounds normal.  Abdominal: Soft. He exhibits no distension. There is no tenderness. There is no guarding.  Musculoskeletal: He exhibits no edema.  No tenderness with range of motion of hips knees and wrists elbows or shoulders. No midline cervical lumbar or thoracic tenderness full range of motion head neck.  Neurological: He is alert and oriented to person, place, and time.  Skin: Skin is warm. No rash noted.  Psychiatric: He has a normal mood and affect.  Nursing note and vitals reviewed.   ED Course  Procedures (including critical care time) Labs Review Labs Reviewed - No data to display  Imaging Review Ct Head Wo Contrast  09/08/2014   CLINICAL DATA:  Posttraumatic headache after fall in grocery store.  EXAM: CT HEAD WITHOUT CONTRAST  TECHNIQUE: Contiguous axial images were obtained from the base of the skull through the vertex without intravenous contrast.  COMPARISON:  None.  FINDINGS: Bony calvarium appears intact. Mild right sphenoid sinusitis is noted. Mild diffuse cortical atrophy is noted. Mild chronic ischemic white matter disease is noted. No mass effect or midline shift is noted. Ventricular size is within normal limits. There is no evidence of mass lesion, hemorrhage or acute infarction.  IMPRESSION: Mild diffuse cortical atrophy. Mild chronic ischemic white matter disease. Mild right sphenoid sinusitis. No acute intracranial abnormality seen.   Electronically Signed   By: Marijo Conception, M.D.   On: 09/08/2014 10:40     EKG Interpretation None      MDM   Final diagnoses:  Acute head injury  Epistaxis   Patient presents with mechanical fall. CT scan of the head no acute bleeding. Normal neurologic exam. Outpatient follow-up.  Results and differential diagnosis were discussed with the patient/parent/guardian. Close follow up outpatient was discussed, comfortable with the plan.   Medications  acetaminophen (TYLENOL) tablet  650 mg (650 mg Oral Not Given 09/08/14 1043)    Filed Vitals:   09/08/14 0932  BP: 188/92  Pulse: 70  Temp: 98 F (36.7 C)  TempSrc: Oral  Resp: 17  SpO2: 95%    Final diagnoses:  Acute head injury  Epistaxis       Elnora Morrison, MD 09/08/14 1152

## 2014-09-09 NOTE — Telephone Encounter (Signed)
Pt is scheduled to see you on Monday.  

## 2014-09-14 ENCOUNTER — Ambulatory Visit (INDEPENDENT_AMBULATORY_CARE_PROVIDER_SITE_OTHER): Payer: Medicare Other | Admitting: Family Medicine

## 2014-09-14 ENCOUNTER — Encounter: Payer: Self-pay | Admitting: Family Medicine

## 2014-09-14 VITALS — BP 148/78 | HR 61 | Temp 98.0°F | Wt 164.0 lb

## 2014-09-14 DIAGNOSIS — R413 Other amnesia: Secondary | ICD-10-CM | POA: Diagnosis not present

## 2014-09-14 DIAGNOSIS — L820 Inflamed seborrheic keratosis: Secondary | ICD-10-CM | POA: Diagnosis not present

## 2014-09-14 DIAGNOSIS — D485 Neoplasm of uncertain behavior of skin: Secondary | ICD-10-CM | POA: Diagnosis not present

## 2014-09-14 DIAGNOSIS — F0391 Unspecified dementia with behavioral disturbance: Secondary | ICD-10-CM | POA: Insufficient documentation

## 2014-09-14 DIAGNOSIS — F03918 Unspecified dementia, unspecified severity, with other behavioral disturbance: Secondary | ICD-10-CM | POA: Insufficient documentation

## 2014-09-14 NOTE — Patient Instructions (Signed)
We will call you about pathology results. I am really hoping this is benign and we do not have to take out more area or send you to dermatology  We will place the referral for speech therapy for your memory loss so [facility] can [facility]

## 2014-09-14 NOTE — Assessment & Plan Note (Signed)
Normal neuro exam. 3 word recall 3/3 today. I suspect mild cognitive impairment as cause of short term memory loss. We will trial speech therapy/cognitive therapy through [facility] and follow up if worsening for full MMSE.

## 2014-09-14 NOTE — Progress Notes (Signed)
[name], MD  Subjective:  Hector Pratt is a 81 y.o. year old very pleasant male patient who presents with:  Also see problem oriented charting Memory Loss -reports intermittent short term memory loss issues. [facility] has requested a speech/cognitive eval and patient requires face to face. Ongoing issues. States not worsening.   Did have a fall the other day and seen in ED and negative head CT. Recovering well from this.   ROS- no blurry vision or headaches  Past Medical History- HLD, HTN, prostate cancer history  Medications- reviewed and updated Current Outpatient Prescriptions  Medication Sig Dispense Refill  . glucosamine-chondroitin 500-400 MG tablet Take 1 tablet by mouth daily.    . Multiple Vitamin (MULTIVITAMIN) tablet Take 1 tablet by mouth daily. Centrum    . Omega-3 Fatty Acids (FISH OIL CONCENTRATE) 1000 MG CAPS Take 1,000 mg by mouth daily.     . vitamin C (ASCORBIC ACID) 500 MG tablet Take 1,000 mg by mouth daily.    . carteolol (OCUPRESS) 1 % ophthalmic solution Place 0.5 mLs into both eyes 2 (two) times daily.     Marland Kitchen latanoprost (XALATAN) 0.005 % ophthalmic solution Place 1 drop into both eyes at bedtime.     Objective: BP 148/78 mmHg  Pulse 61  Temp(Src) 98 F (36.7 C)  Wt 164 lb (74.39 kg) Gen: NAD, resting comfortably  4 mm circular lesion on right upper chest with rough texture (oddly not as course as last visit when believed it was a horned lesion)   Neuro: CN II-XII intact, sensation and reflexes normal throughout, 5/5 muscle strength in bilateral upper and lower extremities. Normal finger to nose. Normal rapid alternating movements.   Coding: neoplasm of uncertain behavior if unknown  Skin Biopsy Procedure Note   PRE-OP DIAGNOSIS: _neoplasm of uncertain behavior (squamous cell vs. Seborrheic keratosis vs. Wart) POST-OP DIAGNOSIS: Same  PROCEDURE: shave skin biopsy wiith 1-2 mm margins (had planned 2-10mm) Performing Physician: Dr.  Yong Channel   The area surrounding the skin lesion was prepared and draped in the usual sterile manner. The lesion was removed in the usual manner by the biopsy method noted above. Hemostasis was assured. The patient tolerated the procedure well.  Closure:    electrocautery  Followup: The patient tolerated the procedure well without complications.  Standard post-procedure care is explained and return precautions are given.   Assessment/Plan:  Neoplasm of uncertain behavior of skin S: has not grown since last visit but seemed to quickly grow up to that point A/P: Concerned this was a horned lesion potential squamous cell. Texture is not as coarse today so my concern is less severe. Given growth over last 2 months, we still opted to remove. My highest concern at this point would be wart vs. Atypical seborrheic keratosis. Vs. Lower likelihood squamous cell carcinoma   Memory loss Normal neuro exam. 3 word recall 3/3 today. I suspect mild cognitive impairment as cause of short term memory loss. We will trial speech therapy/cognitive [facility] through [facility] and follow up if worsening for full MMSE.    This visit served as face to face issue for ST orders.

## 2014-09-14 NOTE — Assessment & Plan Note (Signed)
S: has not grown since last visit but seemed to quickly grow up to that point A/P: Concerned this was a horned lesion potential squamous cell. Texture is not as coarse today so my concern is less severe. Given growth over last 2 months, we still opted to remove. My highest concern at this point would be wart vs. Atypical seborrheic keratosis. Vs. Lower likelihood squamous cell carcinoma

## 2014-09-17 ENCOUNTER — Telehealth: Payer: Self-pay | Admitting: *Deleted

## 2014-09-17 NOTE — Telephone Encounter (Signed)
Misty called from [facility] pathology and requested to verify the site of the specimen she received yesterday.  I read her the note from the office visit per Dr Yong Channel which states a 50mm circular lesion right upper chest.

## 2014-11-02 DIAGNOSIS — Z961 Presence of intraocular lens: Secondary | ICD-10-CM | POA: Diagnosis not present

## 2014-11-02 DIAGNOSIS — H4011X2 Primary open-angle glaucoma, moderate stage: Secondary | ICD-10-CM | POA: Diagnosis not present

## 2014-11-20 ENCOUNTER — Telehealth: Payer: Self-pay | Admitting: Family Medicine

## 2014-11-20 NOTE — Telephone Encounter (Signed)
Pt is having on going feet swelling and per daughter unable to come in today. Can I use sda slot for Monday 11-23-14 ?

## 2014-11-20 NOTE — Telephone Encounter (Signed)
That is fine 

## 2014-11-20 NOTE — Telephone Encounter (Signed)
Pt has been sch

## 2014-11-23 ENCOUNTER — Encounter: Payer: Self-pay | Admitting: Family Medicine

## 2014-11-23 ENCOUNTER — Ambulatory Visit (INDEPENDENT_AMBULATORY_CARE_PROVIDER_SITE_OTHER): Payer: Medicare Other | Admitting: Family Medicine

## 2014-11-23 VITALS — BP 140/84 | HR 58 | Temp 98.5°F | Wt 152.0 lb

## 2014-11-23 DIAGNOSIS — R269 Unspecified abnormalities of gait and mobility: Secondary | ICD-10-CM

## 2014-11-23 DIAGNOSIS — R609 Edema, unspecified: Secondary | ICD-10-CM | POA: Diagnosis not present

## 2014-11-23 DIAGNOSIS — R21 Rash and other nonspecific skin eruption: Secondary | ICD-10-CM

## 2014-11-23 DIAGNOSIS — I872 Venous insufficiency (chronic) (peripheral): Secondary | ICD-10-CM

## 2014-11-23 DIAGNOSIS — L570 Actinic keratosis: Secondary | ICD-10-CM

## 2014-11-23 NOTE — Progress Notes (Signed)
Pre visit review using our clinic review tool, if applicable. No additional management support is needed unless otherwise documented below in the visit note. 

## 2014-11-23 NOTE — Patient Instructions (Signed)
Medication Instructions:  Same, see list Wear compression stockings daily including to visits  Labwork: Before you leave  Testing/Procedures We will call you within a week about your echocardiogram (ultrasound of heart) and leg ultrasound. If you do not hear within by end of week, give [place] a call.   Follow-Up (all visit scheduling, rescheduling, cancellations including labs should be scheduled at front desk): Touch base 2-3 weeks   Sooner if new or worsening symptoms

## 2014-11-23 NOTE — Progress Notes (Signed)
Hector Reddish, MD  Subjective:  Hector Pratt is a 81 y.o. year old very pleasant male patient who presents with:  See problem oriented charting ROS- no chest pain, shortness of breath, worsening leg swelling. No palpitations.   Past Medical History- hypertension, hyperlipidemia, bradycardia, constipation, history prostate cancer, glucoma  Medications- reviewed and updated Current Outpatient Prescriptions  Medication Sig Dispense Refill  . carteolol (OCUPRESS) 1 % ophthalmic solution Place 0.5 mLs into both eyes 2 (two) times daily.     Marland Kitchen glucosamine-chondroitin 500-400 MG tablet Take 1 tablet by mouth daily.    Marland Kitchen latanoprost (XALATAN) 0.005 % ophthalmic solution Place 1 drop into both eyes at bedtime.    . Multiple Vitamin (MULTIVITAMIN) tablet Take 1 tablet by mouth daily. Centrum    . Omega-3 Fatty Acids (FISH OIL CONCENTRATE) 1000 MG CAPS Take 1,000 mg by mouth daily.     . vitamin C (ASCORBIC ACID) 500 MG tablet Take 1,000 mg by mouth daily.    . TRAVATAN Z 0.004 % SOLN ophthalmic solution      Objective: BP 140/84 mmHg  Pulse 58  Temp(Src) 98.5 F (36.9 C) (Oral)  Wt 152 lb (68.947 kg) Gen: NAD, resting comfortably CV: RRR no murmurs rubs or gallops Lungs: CTAB perhaps faint crackle at base, wheeze, rhonchi Abdomen: soft/nontender/nondistended/normal bowel sounds.  Ext: 2+  Edema on right, 1+ on left unchanged from previous (not wearing compression stockings as instructed). No swelling above the knee. No calf tenderness Skin: warm,  Scalp with multiple erythematous patches with scaling Neuro: walks with walker  Assessment/Plan:  Venous (peripheral) insufficiency Edema S: continued issue from last visit. No improvement. Claims to be wearing compression stockings but not wearing today. He would like to investigate further wheras last visit preferred conservative measures.  A/P: get duplex of legs, echo of heart. Labs have returned and show no cause for edema. I still  strongly suspect venous insufficiency.  Plan: CBC, Comprehensive metabolic panel, TSH, LE VENOUS, ECHOCARDIOGRAM COMPLETE  Rash and nonspecific skin eruption. On scalp for months, itchy red lesions. Appear to be extensive AKs  - Plan: Ambulatory referral to Dermatology  Also, Patient has a gait disturbance likely due to his age primarily. He is using a rolling walker today and appears much more steady. This visit will serve for Face to face for rolling walker and PT eval for abnormality of gait  Orders Placed This Encounter  Procedures  . CBC    [place]  . Comprehensive metabolic panel    [place]  . TSH    [place]  . Ambulatory referral to Dermatology    Referral Priority:  Routine    Referral Type:  Consultation    Referral Reason:  Specialty Services Required    Requested Specialty:  Dermatology    Number of Visits Requested:  1  . ECHOCARDIOGRAM COMPLETE    Standing Status: Future     Number of Occurrences:      Standing Expiration Date: 02/23/2016    Scheduling Instructions:     edema    Order Specific Question:  Where should this test be performed    Answer:  [facility]    Order Specific Question:  Complete or Limited study?    Answer:  Complete    Order Specific Question:  With Image Enhancing Agent or without Image Enhancing Agent?    Answer:  With Image Enhancing Agent    Order Specific Question:  Reason for exam-Echo    Answer:  Other -  See Comments Section

## 2014-11-24 ENCOUNTER — Encounter: Payer: Self-pay | Admitting: Gastroenterology

## 2014-11-24 LAB — COMPREHENSIVE METABOLIC PANEL
ALBUMIN: 3.9 g/dL (ref 3.5–5.2)
ALK PHOS: 57 U/L (ref 39–117)
ALT: 15 U/L (ref 0–53)
AST: 20 U/L (ref 0–37)
BILIRUBIN TOTAL: 0.4 mg/dL (ref 0.2–1.2)
BUN: 22 mg/dL (ref 6–23)
CO2: 33 mEq/L — ABNORMAL HIGH (ref 19–32)
CREATININE: 0.93 mg/dL (ref 0.40–1.50)
Calcium: 9.4 mg/dL (ref 8.4–10.5)
Chloride: 105 mEq/L (ref 96–112)
GFR: 80.63 mL/min (ref >60.00)
Glucose, Bld: 124 mg/dL — ABNORMAL HIGH (ref 70–99)
Potassium: 4.8 mEq/L (ref 3.5–5.1)
Sodium: 141 mEq/L (ref 135–145)
TOTAL PROTEIN: 6.9 g/dL (ref 6.0–8.3)

## 2014-11-24 LAB — CBC
HCT: 39.6 % (ref 39.0–52.0)
Hemoglobin: 13.3 g/dL (ref 13.0–17.0)
MCHC: 33.6 g/dL (ref 30.0–36.0)
MCV: 99.8 fl (ref 78.0–100.0)
Platelets: 262 10*3/uL (ref 150.0–400.0)
RBC: 3.97 Mil/uL — AB (ref 4.22–5.81)
RDW: 13.9 % (ref 11.5–15.5)
WBC: 6.3 10*3/uL (ref 4.0–10.5)

## 2014-11-24 LAB — TSH: TSH: 1.31 u[IU]/mL (ref 0.35–4.50)

## 2014-11-24 NOTE — Assessment & Plan Note (Signed)
S: continued issue from last visit. No improvement. Claims to be wearing compression stockings but not wearing today. He would like to investigate further wheras last visit preferred conservative measures.  A/P: get duplex of legs, echo of heart. Labs have returned and show no cause for edema. I still strongly suspect venous insufficiency.

## 2014-11-25 ENCOUNTER — Telehealth (HOSPITAL_COMMUNITY): Payer: Self-pay | Admitting: *Deleted

## 2014-12-04 ENCOUNTER — Other Ambulatory Visit: Payer: Self-pay

## 2014-12-04 ENCOUNTER — Ambulatory Visit (HOSPITAL_COMMUNITY): Payer: Medicare Other | Attending: Cardiovascular Disease

## 2014-12-04 DIAGNOSIS — Z87891 Personal history of nicotine dependence: Secondary | ICD-10-CM | POA: Insufficient documentation

## 2014-12-04 DIAGNOSIS — I10 Essential (primary) hypertension: Secondary | ICD-10-CM | POA: Diagnosis not present

## 2014-12-04 DIAGNOSIS — E785 Hyperlipidemia, unspecified: Secondary | ICD-10-CM | POA: Diagnosis not present

## 2014-12-04 DIAGNOSIS — R609 Edema, unspecified: Secondary | ICD-10-CM | POA: Insufficient documentation

## 2014-12-04 DIAGNOSIS — I517 Cardiomegaly: Secondary | ICD-10-CM | POA: Diagnosis not present

## 2014-12-07 ENCOUNTER — Encounter: Payer: Self-pay | Admitting: Family Medicine

## 2014-12-08 ENCOUNTER — Ambulatory Visit (HOSPITAL_COMMUNITY)
Admission: RE | Admit: 2014-12-08 | Discharge: 2014-12-08 | Disposition: A | Discharge status: Home / Self Care | Payer: Medicare Other | Source: Ambulatory Visit | Attending: Cardiology | Admitting: Cardiology

## 2014-12-08 ENCOUNTER — Telehealth: Payer: Self-pay | Admitting: Family Medicine

## 2014-12-08 DIAGNOSIS — I10 Essential (primary) hypertension: Secondary | ICD-10-CM | POA: Insufficient documentation

## 2014-12-08 DIAGNOSIS — R609 Edema, unspecified: Secondary | ICD-10-CM | POA: Diagnosis not present

## 2014-12-08 DIAGNOSIS — E785 Hyperlipidemia, unspecified: Secondary | ICD-10-CM | POA: Insufficient documentation

## 2014-12-08 DIAGNOSIS — F17200 Nicotine dependence, unspecified, uncomplicated: Secondary | ICD-10-CM | POA: Diagnosis not present

## 2014-12-08 NOTE — Telephone Encounter (Signed)
Patient called on 12/05/2014 to let the MD know that he had his echocardiogram and it went well and it was accomplished. Patient states that the MD should be receiving the results

## 2014-12-08 NOTE — Telephone Encounter (Signed)
You had left a message for patient to call back-please give him the message. "Hector Pratt please call patient. No cause of swelling was found on echocardiogram. Good pumping function (EF 55%) and valves normal"

## 2014-12-08 NOTE — Telephone Encounter (Signed)
FYI

## 2014-12-09 NOTE — Telephone Encounter (Signed)
Called pt again and notified him of results.

## 2014-12-24 ENCOUNTER — Telehealth: Payer: Self-pay | Admitting: Family Medicine

## 2014-12-24 NOTE — Telephone Encounter (Signed)
[facility] will be sending over a packet w/ FL2 , standing orders, and needs notes from lastest history and cpe  Pt will be moving into assisted living and they need this filled out. Just want you to know so you can be on the lookout. Contact info [place] [phone]  ext [phone]

## 2014-12-24 NOTE — Telephone Encounter (Signed)
Will be looking out.

## 2014-12-25 NOTE — Telephone Encounter (Signed)
Form received and pt being scheduled.

## 2014-12-28 ENCOUNTER — Ambulatory Visit (INDEPENDENT_AMBULATORY_CARE_PROVIDER_SITE_OTHER): Payer: Medicare Other | Admitting: Family Medicine

## 2014-12-28 ENCOUNTER — Encounter: Payer: Self-pay | Admitting: Family Medicine

## 2014-12-28 ENCOUNTER — Telehealth: Payer: Self-pay | Admitting: Family Medicine

## 2014-12-28 VITALS — BP 122/68 | HR 58 | Temp 98.1°F | Wt 161.0 lb

## 2014-12-28 DIAGNOSIS — R413 Other amnesia: Secondary | ICD-10-CM

## 2014-12-28 DIAGNOSIS — Z66 Do not resuscitate: Secondary | ICD-10-CM

## 2014-12-28 DIAGNOSIS — I10 Essential (primary) hypertension: Secondary | ICD-10-CM | POA: Diagnosis not present

## 2014-12-28 DIAGNOSIS — E785 Hyperlipidemia, unspecified: Secondary | ICD-10-CM | POA: Diagnosis not present

## 2014-12-28 DIAGNOSIS — Z23 Encounter for immunization: Secondary | ICD-10-CM

## 2014-12-28 DIAGNOSIS — I872 Venous insufficiency (chronic) (peripheral): Secondary | ICD-10-CM

## 2014-12-28 DIAGNOSIS — R001 Bradycardia, unspecified: Secondary | ICD-10-CM

## 2014-12-28 NOTE — Assessment & Plan Note (Signed)
S: History of hypertension now controlled off medicine. Diet controlled A/P: Continue without medication

## 2014-12-28 NOTE — Assessment & Plan Note (Signed)
S:Some edema on hot days. Wears compression stockings (old). Was unable to get on new compression stocking Rx extensive recent workup for edema at patient's request was unrevealing.  A/P:Continue compression stockings and leg elevation as needed.

## 2014-12-28 NOTE — Assessment & Plan Note (Signed)
DO NOT RESUSCITATE form signed.Signed for [facility] on 12/28/14

## 2014-12-28 NOTE — Assessment & Plan Note (Signed)
No recent issues. Usually with 3 days between bowel movements and small pellets.

## 2014-12-28 NOTE — Patient Instructions (Signed)
Filled out fl2- kept a copy in case we need to redo. Let us know anything else you need

## 2014-12-28 NOTE — Assessment & Plan Note (Signed)
S: Follows with Dr. Kathrin Penner. Takes carteolol  And latanoprost.  A/P: Continue Rx through ophthalmology

## 2014-12-28 NOTE — Assessment & Plan Note (Signed)
S: Mild elevations Lab Results  Component Value Date   CHOL 198 04/13/2014   HDL 52.80 04/13/2014   LDLCALC 128* 04/13/2014   LDLDIRECT 113.5 10/09/2011   TRIG 85.0 04/13/2014   CHOLHDL 4 04/13/2014  A/P: Unclear benefit of primary prevention. Patient will continue off medication

## 2014-12-28 NOTE — Telephone Encounter (Signed)
Noted  

## 2014-12-28 NOTE — Progress Notes (Signed)
Hector Reddish, MD  Subjective:  Hector Pratt is a 81 y.o. year old very pleasant male patient who presents for/with See problem oriented charting. This is the history and physical for [facility] ROS- no chest pain or shortness of breath. Does have periods of intermittent confusion. Difficulty hearing. No headaches or blurry vision.  Past Medical History  Diagnosis Date  . HYPERLIPIDEMIA 01/02/2007  . DEPRESSION 01/02/2007    patient denies history  . GLAUCOMA NOS 01/03/2007  . CERUMEN IMPACTION 09/01/2008  . ACUT SUPPRATV OTITIS MEDIA W/O SPONT RUP EARDRUM 09/01/2008    patient denies rupture  . HYPERTENSION 10/30/2007  . VENOUS INSUFFICIENCY, CHRONIC 01/03/2007  . ANKLE INJURY 02/28/2010  . PROSTATE CANCER, HX OF 01/02/2007  . DJD (degenerative joint disease)    Patient Active Problem List   Diagnosis Date Noted  . DNR (do not resuscitate) 12/28/2014    Priority: High  . Memory loss 09/14/2014    Priority: Medium  . Bradycardia 11/11/2013    Priority: Medium  . Actinic keratosis 11/11/2013    Priority: Low  . Slow transit constipation 11/04/2010    Priority: Low  . Essential hypertension 10/30/2007    Priority: Low  . Glaucoma 01/03/2007    Priority: Low  . Venous (peripheral) insufficiency 01/03/2007    Priority: Low  . Hyperlipidemia 01/02/2007    Priority: Low  . PROSTATE CANCER, HX OF 01/02/2007    Priority: Low  . Neoplasm of uncertain behavior of skin 08/28/2014   Past Surgical History  Procedure Laterality Date  . Colonoscopy    . Lymph node removal      associated with prostate cancer    Family History  Problem Relation Age of Onset  . Alcohol abuse Other   . Cancer Other 50    relative  . Stroke Other 50    relative  . Prostate cancer Other     1st degree relative <50    Medications- reviewed and updated Current Outpatient Prescriptions  Medication Sig Dispense Refill  . carteolol (OCUPRESS) 1 % ophthalmic solution Place 0.5  mLs into both eyes 2 (two) times daily.     . TRAVATAN Z 0.004 % SOLN ophthalmic solution      No current facility-administered medications for this visit.    Allergies-reviewed and updated No Known Allergies  Social History   Social History  . Marital Status: Married    Spouse Name: N/A  . Number of Children: N/A  . Years of Education: N/A   Occupational History  . Retired    Social History Main Topics  . Smoking status: Former Smoker -- 1.00 packs/day for 19 years    Types: Cigarettes    Quit date: 10/04/1957  . Smokeless tobacco: Never Used  . Alcohol Use: No  . Drug Use: No  . Sexual Activity: Yes   Other Topics Concern  . None   Social History Narrative   Retired in 1989 from [employer] to [employer] in 89 from [place] (10 years)   Lives with wife. Lives in retirement home-independent living [place].    Does all ADLS and IADLs in [place].    Married 25 years, lost 1st wife due to cancer. 2 children, wife has 1 children. Son worked for [employer] and lived 3 years ago. 1 grandkid on wife's side, 2 on her side. No great grandkids.       Hobbies: used to play golf until recently, watch tv  Objective: BP 122/68 mmHg  Pulse 58  Temp(Src) 98.1 F (36.7 C)  Wt 161 lb (73.029 kg) Gen: NAD, resting comfortably Mucous membranes are moist. ENT: difficulty hearing noted CV: RRR no murmurs rubs or gallops Lungs: CTAB no crackles, wheeze, rhonchi Abdomen: soft/nontender/nondistended/normal bowel sounds. No rebound or guarding.  Ext: 2+ edema R, 1+ edema on L- no compression stockings on Skin: warm, dry Neuro: grossly normal, moves all extremities, walks with walker  Assessment/Plan:  DNR (do not resuscitate) DO NOT RESUSCITATE form signed.[facility] for [facility] 12/28/14  Essential hypertension S: History of hypertension now controlled off medicine. Diet controlled A/P: Continue without medication   Hyperlipidemia S: Mild  elevations Lab Results  Component Value Date   CHOL 198 04/13/2014   HDL 52.80 04/13/2014   LDLCALC 128* 04/13/2014   LDLDIRECT 113.5 10/09/2011   TRIG 85.0 04/13/2014   CHOLHDL 4 04/13/2014  A/P: Unclear benefit of primary prevention. Patient will continue off medication   Bradycardia S: 1st degree block. Heart rate in 50s and 60s most the time. A/P: No medication needed, asymptomatic   Venous (peripheral) insufficiency S:Some edema on hot days. Wears compression stockings (old). Was unable to get on new compression stocking Rx extensive recent workup for edema at patient's request was unrevealing.  A/P:Continue compression stockings and leg elevation as needed.    Slow transit constipation No recent issues. Usually with 3 days between bowel movements and small pellets.  PROSTATE CANCER, HX OF S:Treatment 1989. No symptoms since that time. Denies any symptoms. A/P:Does not want screening/evaluation.    Glaucoma S: Follows with Dr. Kathrin Penner. Takes carteolol  And latanoprost.  A/P: Continue Rx through ophthalmology   Memory loss S: Primary reason for [facility]. Patient does have intermittent confusion. He has been doing speech therapy and cognitive therapy through [facility]. My notes in the overview suggest "12/01/14 MMSE 24/30" but I do not have notes from that day and unclear who would've completed this MMSE.  A/P: Given intermittent confusion I do not believe it is safe for patient to be at home by himself. Agreed with admission at [facility] [place] [facility]. He has long-term care insurance and we filled out a form today. we also completed an fL2.   kept a copy of FL2 in case needs to be redone along with other paperwork

## 2014-12-28 NOTE — Assessment & Plan Note (Signed)
S: 1st degree block. Heart rate in 50s and 60s most the time. A/P: No medication needed, asymptomatic

## 2014-12-28 NOTE — Telephone Encounter (Signed)
[name] called wanting to know if the [facility] forms have been filled out. I advised them that Hector Pratt has an appoinment today and that the paperwork should be filled out at the end of the visit. They said that it will be okay to give the papers to the son, once they're complete.

## 2014-12-28 NOTE — Assessment & Plan Note (Signed)
S:Treatment 1989. No symptoms since that time. Denies any symptoms. A/P:Does not want screening/evaluation.

## 2014-12-28 NOTE — Assessment & Plan Note (Signed)
S: Primary reason for admission [facility]. Patient does have intermittent confusion. He has been doing speech therapy and cognitive therapy through [facility]. My notes in the overview suggest "12/01/14 MMSE 24/30" but I do not have notes from that day and unclear who would've completed this MMSE.  A/P: Given intermittent confusion I do not believe it is safe for patient to be at home by himself. Agreed with admission at [facility] [place] [facility]. He has long-term care insurance and we filled out a form today. we also completed an fL2.

## 2015-04-28 ENCOUNTER — Encounter: Payer: Self-pay | Admitting: Family Medicine

## 2015-04-28 ENCOUNTER — Ambulatory Visit (INDEPENDENT_AMBULATORY_CARE_PROVIDER_SITE_OTHER): Payer: Medicare Other | Admitting: Family Medicine

## 2015-04-28 VITALS — BP 130/64 | HR 58 | Temp 98.0°F | Wt 161.0 lb

## 2015-04-28 DIAGNOSIS — R410 Disorientation, unspecified: Secondary | ICD-10-CM

## 2015-04-28 DIAGNOSIS — R358 Other polyuria: Secondary | ICD-10-CM | POA: Diagnosis not present

## 2015-04-28 DIAGNOSIS — R413 Other amnesia: Secondary | ICD-10-CM | POA: Diagnosis not present

## 2015-04-28 DIAGNOSIS — R3589 Other polyuria: Secondary | ICD-10-CM

## 2015-04-28 NOTE — Progress Notes (Signed)
Hector Reddish, MD  Subjective:  Hector Pratt is a 82 y.o. year old very pleasant male patient who presents for/with See problem oriented charting ROS- no cough, congestion, dysuria, diarrhearunny nose, ear pain, abdominal pain, chest pain, shortness of breath. No facial or extremity weakness. No slurred words but does mumble at times or trouble swallowing. no blurry vision or double vision. No paresthesias. No confusion or word finding difficulties.   Past Medical History-  Patient Active Problem List   Diagnosis Date Noted  . DNR (do not resuscitate) 12/28/2014    Priority: High  . Memory loss 09/14/2014    Priority: Medium  . Bradycardia 11/11/2013    Priority: Medium  . Actinic keratosis 11/11/2013    Priority: Low  . Slow transit constipation 11/04/2010    Priority: Low  . Essential hypertension 10/30/2007    Priority: Low  . Glaucoma 01/03/2007    Priority: Low  . Venous (peripheral) insufficiency 01/03/2007    Priority: Low  . Hyperlipidemia 01/02/2007    Priority: Low  . PROSTATE CANCER, HX OF 01/02/2007    Priority: Low  . Neoplasm of uncertain behavior of skin 08/28/2014    Medications- reviewed and updated Current Outpatient Prescriptions  Medication Sig Dispense Refill  . carteolol (OCUPRESS) 1 % ophthalmic solution Place 0.5 mLs into both eyes 2 (two) times daily.     . TRAVATAN Z 0.004 % SOLN ophthalmic solution      No current facility-administered medications for this visit.    Objective: BP 130/64 mmHg  Pulse 58  Temp(Src) 98 F (36.7 C)  Wt 161 lb (73.029 kg) Gen: NAD, resting comfortably CV: RRR no murmurs rubs or gallops Lungs: CTAB no crackles, wheeze, rhonchi Abdomen: soft/nontender/nondistended/normal bowel sounds. No rebound or guarding.  Ext: no edema Skin: warm, dry Neuro: CN II-XII intact, sensation and reflexes normal throughout, 5/5 muscle strength in bilateral upper and lower extremities. Normal finger to nose. Normal rapid  alternating movements. No pronator drift. Normal romberg. Normal gait. Sometimes slow to be able to do command but is able to complete. Alert to person and place. Thinks time in 61s and month December. Not aware to reason for visit.   Assessment/Plan:  Confusion in patient with baseline memory loss likely mild cognitive impairment but ? alzheimers dementa S: Started with increased confusion since early this week. Has forgotten to do things like misplacing things more than normal, not putting battery in hearing aid, lots of forgetfulness. A few days ago was particularly bad- trouble getting dressed, talking about daughter that is not local as if she should be here "where's susan". Wife has noted frequent urination but no recent increase from baseline.  O: not oriented to time, reason for visit. Oriented to person, place.  A/P: My suspicion is worsening from baseline with likely baseline alzheimers dementia but this could also be delirium (waxing and waning levels of confusion. We will get cbc with diff and Urine culture to evaluate for potential infectious source but there were no findings on exam or history. Also we have not done full reversible cause of memory loss/dementia workup in the past so we will also do CMET, RPR, HIV, TSH, b12. Plan was also for MRI due to recent stepwise decline (? Vascular dementia) but wife would like to hold off until bloodwork completed. We discussed getting a new baseline mmse in 4-6 weeks if workup unremarkable (including MRI) to get new baseline. Unclear if patient/wife would be interested in medication. Doubt depression as cause  and did not test phq9  If cannot give urine today- [facility] at [facility]  Return in about 6 weeks (around 06/09/2015) for 4-6 weeks at lastest. may see sooner depending on labs. Sooner Return precautions advised including any new symptoms or worsening cognition.   Orders Placed This Encounter  Procedures  . Urine culture    [facility]   . CBC with Differential/Platelet  . Comprehensive metabolic panel    [place]  . HIV antibody  . RPR    [facility]  . Vitamin B12  . TSH    [place]  . POCT urinalysis dipstick    In house

## 2015-04-28 NOTE — Patient Instructions (Addendum)
Labs before you go. Look for cause of increasing confusion. This could be due to underlying dementia that is worsening but we need to rule out other causes. If we cant find a cause on workup including after MRI- then we would reestablish new baseline MMSE next month  We discussed stroke as a possibility for increased confusion. The rest of his exam is normal and does not suggest this. Considered MRI to further evaluate but you wanted to hold off for now in case we can find a reason for his confusion on labs.

## 2015-05-05 ENCOUNTER — Telehealth: Payer: Self-pay | Admitting: Family Medicine

## 2015-05-05 NOTE — Telephone Encounter (Signed)
Pt never got labs the day he was here, pt is returning today to have labs drawn. I will then fax them to [place] once they are back.

## 2015-05-05 NOTE — Telephone Encounter (Signed)
Elmyra Ricks from [facility] call to ask if pt lab results done on 04/28/15 can be faxed over to them.   Fax number [phone]  [facility] phone number [phone]

## 2015-05-07 NOTE — Telephone Encounter (Signed)
Spoke with patient and he has to have transportation to get here to get his labs, he will call me when he is able to find someone to bring him for lab work.

## 2015-05-07 NOTE — Telephone Encounter (Signed)
Spoke with Elmyra Ricks from [facility] and she states she can fax over the lab orders for you to sign off on and they can draw them there and then fax [place] the results when they get them. Due to pts cognitive states Elmyra Ricks does not think its safe for pt to try and get here on his own to have these labs done, she will be sending the order for you to sign off on for him to have these labs drawn there that you have ordered in [facility].

## 2015-05-07 NOTE — Telephone Encounter (Signed)
Sounds good- please place on desk with sticky and I will give back to you directly

## 2015-05-10 NOTE — Telephone Encounter (Signed)
Orders faxed

## 2015-05-13 LAB — BASIC METABOLIC PANEL
BUN: 21 mg/dL (ref 4–21)
CREATININE: 0.8 mg/dL (ref 0.6–1.3)
Glucose: 82 mg/dL
Potassium: 4.1 mmol/L (ref 3.4–5.3)
SODIUM: 141 mmol/L (ref 137–147)

## 2015-05-13 LAB — CBC AND DIFFERENTIAL
HEMATOCRIT: 36 % — AB (ref 41–53)
HEMOGLOBIN: 12.1 g/dL — AB (ref 13.5–17.5)
PLATELETS: 258 10*3/uL (ref 150–399)
WBC: 5.1 10^3/mL

## 2015-05-13 LAB — HEPATIC FUNCTION PANEL
ALK PHOS: 83 U/L (ref 25–125)
ALT: 10 U/L (ref 10–40)
AST: 16 U/L (ref 14–40)
BILIRUBIN, TOTAL: 0.4 mg/dL

## 2015-05-14 ENCOUNTER — Encounter: Payer: Self-pay | Admitting: Family Medicine

## 2015-06-18 ENCOUNTER — Ambulatory Visit: Payer: Medicare Other | Admitting: Family Medicine

## 2015-06-19 ENCOUNTER — Ambulatory Visit: Payer: Medicare Other | Admitting: Family Medicine

## 2015-06-21 ENCOUNTER — Ambulatory Visit: Payer: Medicare Other | Admitting: Family Medicine

## 2015-06-21 ENCOUNTER — Encounter: Payer: Self-pay | Admitting: Family Medicine

## 2015-06-21 ENCOUNTER — Ambulatory Visit (INDEPENDENT_AMBULATORY_CARE_PROVIDER_SITE_OTHER): Payer: Medicare Other | Admitting: Family Medicine

## 2015-06-21 VITALS — BP 142/64 | HR 62 | Temp 98.1°F | Wt 167.0 lb

## 2015-06-21 DIAGNOSIS — R410 Disorientation, unspecified: Secondary | ICD-10-CM | POA: Diagnosis not present

## 2015-06-21 DIAGNOSIS — R413 Other amnesia: Secondary | ICD-10-CM

## 2015-06-21 LAB — BASIC METABOLIC PANEL
BUN: 25 mg/dL — AB (ref 6–23)
CALCIUM: 9.1 mg/dL (ref 8.4–10.5)
CHLORIDE: 103 meq/L (ref 96–112)
CO2: 31 mEq/L (ref 19–32)
CREATININE: 1.03 mg/dL (ref 0.40–1.50)
GFR: 71.58 mL/min (ref >60.00)
Glucose, Bld: 114 mg/dL — ABNORMAL HIGH (ref 70–99)
Potassium: 4.3 mEq/L (ref 3.5–5.1)
Sodium: 139 mEq/L (ref 135–145)

## 2015-06-21 NOTE — Progress Notes (Signed)
Hector Reddish, MD  Subjective:  Hector Pratt is a 82 y.o. year old very pleasant male patient who presents for/with See problem oriented charting ROS- no fever or chills. Did have some diarrhea but now resolved mild increase in polyuria from baseline.   Past Medical History-  Patient Active Problem List   Diagnosis Date Noted  . DNR (do not resuscitate) 12/28/2014    Priority: High  . Memory loss 09/14/2014    Priority: Medium  . Bradycardia 11/11/2013    Priority: Medium  . Actinic keratosis 11/11/2013    Priority: Low  . Slow transit constipation 11/04/2010    Priority: Low  . Essential hypertension 10/30/2007    Priority: Low  . Glaucoma 01/03/2007    Priority: Low  . Venous (peripheral) insufficiency 01/03/2007    Priority: Low  . Hyperlipidemia 01/02/2007    Priority: Low  . PROSTATE CANCER, HX OF 01/02/2007    Priority: Low  . Neoplasm of uncertain behavior of skin 08/28/2014    Medications- reviewed and updated Current Outpatient Prescriptions  Medication Sig Dispense Refill  . carteolol (OCUPRESS) 1 % ophthalmic solution Place 0.5 mLs into both eyes 2 (two) times daily.     . TRAVATAN Z 0.004 % SOLN ophthalmic solution      No current facility-administered medications for this visit.    Objective: BP 142/64 mmHg  Pulse 62  Temp(Src) 98.1 F (36.7 C)  Wt 167 lb (75.751 kg) Gen: NAD, resting comfortably Oropharynx normal. CV: RRR no murmurs rubs or gallops Lungs: CTAB no crackles, wheeze, rhonchi Abdomen: soft/nontender/nondistended/normal bowel sounds. No rebound or guarding.  Ext: no edema Skin: warm, dry, no rash Neuro: Thinks it is 2016, has a hard time following questions due to decreased hearing  Assessment/Plan:  Memory loss Confusion S: continued issues with confusion. Wife has a hard time explaining what issues patients has. Patient admits he is forgetful at times. Family Hector Pratt who helps them with driving states he has some short  term memory issues but has a hard time explaining. Prior workup for reversible cause of dementia was negative but MRI was declined. No obvious infectious cause was found last visti. Is having slightly more polyuria at present. Diarrhea a few days ago but completely resolved. The only specific thing they can describe is: Zoning out ,staring spells every so often. No decreased awareness afterwards reviewed 05/13/15- UA normal and urine culture no growth reviewd 05/14/15 hgb 12.1, but rest of CBC, cmp, b12, hiv, b12 normal. rpr did not result but low suspicion syphilis.  A/P: I suspect alzheimers dementia. We will get a urinalysis due to increased polyuria though I doubt urinary tract infection. We will get an MRI brain. Bmet today for contrast purposes. Schedule follow-up after MRI to discuss results and We will get updated MMSE at follow up (forgot hearing aids and difficult to complete today). Consider Aricept potentially. May have Keba reach out to lab at follow up about RPR.    After MRI results. Return precautions advised.   Orders Placed This Encounter  Procedures  . Urine culture    [facility]  . MR Brain W Wo Contrast    Standing Status: Future     Number of Occurrences:      Standing Expiration Date: 08/20/2016    Order Specific Question:  If indicated for the ordered procedure, I authorize the administration of contrast media per Radiology protocol    Answer:  Yes    Order Specific Question:  Reason for  Exam (SYMPTOM  OR DIAGNOSIS REQUIRED)    Answer:  memory loss, rule out CVA contributing    Order Specific Question:  Preferred imaging location?    Answer:  [facility] (table limit-550lbs)    Order Specific Question:  Does the patient have a pacemaker or implanted devices?    Answer:  No    Order Specific Question:  What is the patient's sedation requirement?    Answer:  No Sedation  . Basic metabolic panel    Hector Pratt

## 2015-06-21 NOTE — Patient Instructions (Signed)
Refer for MRI. We will call you about this scheduling.   Once scheduled- we will call to set up follow up appointment. Bring hearing aids- we need to do the full mini mental status exam. We will need to do this in a 30 minute slot as well.   Labs before you go

## 2015-06-21 NOTE — Assessment & Plan Note (Addendum)
Confusion S: continued issues with confusion. Wife has a hard time explaining what issues patients has. Patient admits he is forgetful at times. Family Sharla Kidney who helps them with driving states he has some short term memory issues but has a hard time explaining. Prior workup for reversible cause of dementia was negative but MRI was declined. No obvious infectious cause was found last visti. Is having slightly more polyuria at present. Diarrhea a few days ago but completely resolved. The only specific thing they can describe is: Zoning out ,staring spells every so often. No decreased awareness afterwards reviewed 05/13/15- UA normal and urine culture no growth reviewd 05/14/15 hgb 12.1, but rest of CBC, cmp, b12, hiv, b12 normal. rpr did not result but low suspicion syphilis.  A/P: I suspect alzheimers dementia. We will get a urinalysis due to increased polyuria though I doubt urinary tract infection. We will get an MRI brain. Bmet today for contrast purposes. Schedule follow-up after MRI to discuss results and We will get updated MMSE at follow up (forgot hearing aids and difficult to complete today). Consider Aricept potentially. May have Keba reach out to lab at follow up about RPR.

## 2015-06-23 LAB — URINE CULTURE
COLONY COUNT: NO GROWTH
Organism ID, Bacteria: NO GROWTH

## 2015-07-11 ENCOUNTER — Encounter (HOSPITAL_COMMUNITY): Payer: Self-pay

## 2015-07-11 ENCOUNTER — Emergency Department (HOSPITAL_COMMUNITY)
Admission: EM | Admit: 2015-07-11 | Discharge: 2015-07-11 | Disposition: A | Discharge status: Home / Self Care | Payer: Medicare Other | Attending: Emergency Medicine | Admitting: Emergency Medicine

## 2015-07-11 ENCOUNTER — Emergency Department (HOSPITAL_COMMUNITY): Payer: Medicare Other

## 2015-07-11 DIAGNOSIS — H409 Unspecified glaucoma: Secondary | ICD-10-CM | POA: Insufficient documentation

## 2015-07-11 DIAGNOSIS — W01198A Fall on same level from slipping, tripping and stumbling with subsequent striking against other object, initial encounter: Secondary | ICD-10-CM | POA: Diagnosis not present

## 2015-07-11 DIAGNOSIS — Y92129 Unspecified place in nursing home as the place of occurrence of the external cause: Secondary | ICD-10-CM | POA: Insufficient documentation

## 2015-07-11 DIAGNOSIS — S6992XA Unspecified injury of left wrist, hand and finger(s), initial encounter: Secondary | ICD-10-CM | POA: Diagnosis present

## 2015-07-11 DIAGNOSIS — Z8659 Personal history of other mental and behavioral disorders: Secondary | ICD-10-CM | POA: Insufficient documentation

## 2015-07-11 DIAGNOSIS — Z79899 Other long term (current) drug therapy: Secondary | ICD-10-CM | POA: Insufficient documentation

## 2015-07-11 DIAGNOSIS — Z8546 Personal history of malignant neoplasm of prostate: Secondary | ICD-10-CM | POA: Insufficient documentation

## 2015-07-11 DIAGNOSIS — Y998 Other external cause status: Secondary | ICD-10-CM | POA: Insufficient documentation

## 2015-07-11 DIAGNOSIS — I10 Essential (primary) hypertension: Secondary | ICD-10-CM | POA: Diagnosis not present

## 2015-07-11 DIAGNOSIS — S61412A Laceration without foreign body of left hand, initial encounter: Secondary | ICD-10-CM | POA: Diagnosis not present

## 2015-07-11 DIAGNOSIS — Y9389 Activity, other specified: Secondary | ICD-10-CM | POA: Diagnosis not present

## 2015-07-11 DIAGNOSIS — Z8739 Personal history of other diseases of the musculoskeletal system and connective tissue: Secondary | ICD-10-CM | POA: Diagnosis not present

## 2015-07-11 DIAGNOSIS — W19XXXA Unspecified fall, initial encounter: Secondary | ICD-10-CM

## 2015-07-11 DIAGNOSIS — Z8639 Personal history of other endocrine, nutritional and metabolic disease: Secondary | ICD-10-CM | POA: Insufficient documentation

## 2015-07-11 DIAGNOSIS — Z87891 Personal history of nicotine dependence: Secondary | ICD-10-CM | POA: Diagnosis not present

## 2015-07-11 MED ORDER — LIDOCAINE-EPINEPHRINE (PF) 2 %-1:200000 IJ SOLN
20.0000 mL | Freq: Once | INTRAMUSCULAR | Status: AC
  Administered 2015-07-11: 20 mL via INTRADERMAL
  Filled 2015-07-11: qty 20

## 2015-07-11 MED ORDER — LIDOCAINE-EPINEPHRINE 1 %-1:100000 IJ SOLN: 20.0000 mL | Freq: Once | INTRAMUSCULAR | Status: DC

## 2015-07-11 NOTE — ED Notes (Signed)
This nurse went to room and found pt trying to get out of bed, needing to urinate.  This nurse and student nurse assisted pt to stand at side of bed to urinate.  Pt took bandage off left hand, oozing blood, this nurse reapplied 4x4's and wrapped in gauze.  Pt to xray.

## 2015-07-11 NOTE — ED Notes (Signed)
To room via EMS.  Onset 5:30p witnessed fall from a standing position landing on his right side.  Hit the back of his head.  Has a laceration in palm of left hand d/t fingernails.  Little fingernail ripped off, has bandaid on.  No other complaints.

## 2015-07-11 NOTE — ED Notes (Signed)
Patient transported to X-ray 

## 2015-07-11 NOTE — ED Provider Notes (Signed)
CSN: [id number]     Arrival date & time 07/11/15  1848 History   First MD Initiated Contact with Patient 07/11/15 1909     Chief Complaint  Patient presents with  . Fall  . Laceration     (Consider location/radiation/quality/duration/timing/severity/associated sxs/prior Treatment) Patient is a 82 y.o. male presenting with fall and skin laceration. The history is provided by the patient.  Fall This is a recurrent problem. The current episode started less than 1 hour ago. The problem occurs constantly. The problem has not changed since onset.Pertinent negatives include no chest pain, no abdominal pain, no headaches and no shortness of breath. Nothing aggravates the symptoms. Nothing relieves the symptoms. He has tried nothing for the symptoms. The treatment provided no relief.  Laceration  82 yo M With a chief complaint of a fall. Patient states he had tangled up on his clothes and noted up falling backwards. Duration up to stop himself and noted butting his fingernails through the palmar left hand. Struck the back of his head. Denies loss of consciousness denies neck pain. Denies any other injury. Last tetanus is up-to-date.  Past Medical History  Diagnosis Date  . HYPERLIPIDEMIA 01/02/2007  . DEPRESSION 01/02/2007    patient denies history  . GLAUCOMA NOS 01/03/2007  . CERUMEN IMPACTION 09/01/2008  . ACUT SUPPRATV OTITIS MEDIA W/O SPONT RUP EARDRUM 09/01/2008    patient denies rupture  . HYPERTENSION 10/30/2007  . VENOUS INSUFFICIENCY, CHRONIC 01/03/2007  . ANKLE INJURY 02/28/2010  . PROSTATE CANCER, HX OF 01/02/2007  . DJD (degenerative joint disease)    Past Surgical History  Procedure Laterality Date  . Colonoscopy    . Lymph node removal      associated with prostate cancer   Family History  Problem Relation Age of Onset  . Alcohol abuse Other   . Cancer Other 50    relative  . Stroke Other 50    relative  . Prostate cancer Other     1st degree relative <50   Social  History  Substance Use Topics  . Smoking status: Former Smoker -- 1.00 packs/day for 19 years    Types: Cigarettes    Quit date: 10/04/1957  . Smokeless tobacco: Never Used  . Alcohol Use: No    Review of Systems  Constitutional: Negative for fever and chills.  HENT: Negative for congestion and facial swelling.   Eyes: Negative for discharge and visual disturbance.  Respiratory: Negative for shortness of breath.   Cardiovascular: Negative for chest pain and palpitations.  Gastrointestinal: Negative for vomiting, abdominal pain and diarrhea.  Musculoskeletal: Negative for myalgias and arthralgias.  Skin: Positive for wound. Negative for color change and rash.  Neurological: Negative for tremors, syncope and headaches.  Psychiatric/Behavioral: Negative for confusion and dysphoric mood.      Allergies  Review of patient's allergies indicates no known allergies.  Home Medications   Prior to Admission medications   Medication Sig Start Date End Date Taking? Authorizing Provider  carteolol (OCUPRESS) 1 % ophthalmic solution Place 0.5 mLs into both eyes 2 (two) times daily.     Historical Provider, MD  TRAVATAN Z 0.004 % SOLN ophthalmic solution  11/02/14   Historical Provider, MD   BP 207/103 mmHg  Pulse 54  Temp(Src) 98.1 F (36.7 C)  Resp 16  SpO2 96% Physical Exam  Constitutional: He is oriented to person, place, and time. He appears well-developed and well-nourished.  HENT:  Head: Normocephalic and atraumatic.  Eyes: EOM are normal. Pupils  are equal, round, and reactive to light.  Neck: Normal range of motion. Neck supple. No JVD present.  Cardiovascular: Normal rate and regular rhythm.  Exam reveals no gallop and no friction rub.   No murmur heard. Pulmonary/Chest: No respiratory distress. He has no wheezes.  Abdominal: He exhibits no distension. There is no rebound and no guarding.  Musculoskeletal: Normal range of motion. He exhibits tenderness.  8.5 centimeter  laceration across the palm of the left hand. This is through the subcutaneous tissue. There is some exposed tendon without any noted injury.  No other areas noted of bony tenderness  Neurological: He is alert and oriented to person, place, and time.  Skin: No rash noted. No pallor.  Psychiatric: He has a normal mood and affect. His behavior is normal.  Nursing note and vitals reviewed.   ED Course  .Marland KitchenLaceration Repair Date/Time: 07/11/2015 10:40 PM Performed by: Tyrone Nine Takaya Hyslop Authorized by: Deno Etienne Consent: Verbal consent obtained. Risks and benefits: risks, benefits and alternatives were discussed Consent given by: patient Patient identity confirmed: verbally with patient Time out: Immediately prior to procedure a "time out" was called to verify the correct patient, procedure, equipment, support staff and site/side marked as required. Body area: upper extremity Location details: left hand Laceration length: 8.5 cm Foreign bodies: no foreign bodies Tendon involvement: superficial Nerve involvement: superficial Vascular damage: no Anesthesia: local infiltration Local anesthetic: lidocaine 1% with epinephrine and lidocaine 2% with epinephrine Anesthetic total: 10 ml Patient sedated: no Preparation: Patient was prepped and draped in the usual sterile fashion. Irrigation solution: saline Irrigation method: jet lavage Amount of cleaning: extensive Debridement: none Degree of undermining: none Skin closure: 4-0 nylon Number of sutures: 8 Technique: complex Approximation: close Approximation difficulty: complex Dressing: 4x4 sterile gauze, antibiotic ointment and gauze roll Patient tolerance: Patient tolerated the procedure well with no immediate complications   (including critical care time) Labs Review Labs Reviewed - No data to display  Imaging Review No results found. I have personally reviewed and evaluated these images and lab results as part of my medical  decision-making.   EKG Interpretation None      MDM   Final diagnoses:  Fall, initial encounter  Laceration of hand, left, initial encounter    82 yo M with a chief complaint of a fall. By history is mechanical in nature. Patient with a deep laceration to the palmar aspect of his left hand. No noted tendon injury on inspection. Will obtain a plain film to evaluate for foreign body. CT of the head.  Imaging is negative. Patient wound was explored without finding of a fingernail. Was sutured at bedside. Discharge home.  10:41 PM:  I have discussed the diagnosis/risks/treatment options with the patient and believe the pt to be eligible for discharge home to follow-up with PCP. We also discussed returning to the ED immediately if new or worsening sx occur. We discussed the sx which are most concerning (e.g., sudden worsening pain, fever, redness, drainage) that necessitate immediate return. Medications administered to the patient during their visit and any new prescriptions provided to the patient are listed below.  Medications given during this visit Medications  lidocaine-EPINEPHrine (XYLOCAINE W/EPI) 2 %-1:200000 (PF) injection 20 mL (20 mLs Intradermal Given by Other 07/11/15 2055)    New Prescriptions   No medications on file    The patient appears reasonably screen and/or stabilized for discharge and I doubt any other medical condition or other [facility] requiring further screening, evaluation, or treatment in the  ED at this time prior to discharge.    Deno Etienne, DO 07/11/15 2242

## 2015-07-11 NOTE — Discharge Instructions (Signed)

## 2015-07-11 NOTE — ED Notes (Signed)
Called report to Bowers, nurse, @ [facility].  Retail banker @ [phone] for [facility] transport.

## 2015-07-12 ENCOUNTER — Telehealth: Payer: Self-pay | Admitting: Family Medicine

## 2015-07-12 NOTE — Telephone Encounter (Signed)
Thanks for update

## 2015-07-12 NOTE — Telephone Encounter (Signed)
[facility]att at [facility] would like MRI results faxed to her at [phone] As they have not heard anything

## 2015-07-12 NOTE — Telephone Encounter (Signed)
Have you seen these results yet?

## 2015-07-12 NOTE — Telephone Encounter (Signed)
They called pt 3 different times to return call to schedule and pt never returned their calls, I left a message for the patient to return the call to get the MRI scheduled.

## 2015-07-12 NOTE — Telephone Encounter (Signed)
No, ordered last month and do not see that it has been done. Can you investigate why not done?

## 2015-07-16 ENCOUNTER — Telehealth: Payer: Self-pay | Admitting: Family Medicine

## 2015-07-16 NOTE — Telephone Encounter (Signed)
Sheeja call from [facility] and she changed the dressing on pt left palm and said it looked infected and was swollen. She is asking for an antibiotic. And would like a call back before 3pm [phone] and ask for assisted living ext [facility]

## 2015-07-16 NOTE — Telephone Encounter (Signed)
Schedule OV for pt please per Dr. Yong Channel.

## 2015-07-16 NOTE — Telephone Encounter (Signed)
We have several office visits available this afternoon- would prefer to see patient so I have a baseline to know whether is improving or not. I do not generally just call in antibiotics without treatment. He is seeing me next week but I have no reference point if start treating him without viewing wound.

## 2015-07-16 NOTE — Telephone Encounter (Signed)
See below

## 2015-07-21 ENCOUNTER — Telehealth: Payer: Self-pay | Admitting: Family Medicine

## 2015-07-21 ENCOUNTER — Ambulatory Visit (INDEPENDENT_AMBULATORY_CARE_PROVIDER_SITE_OTHER): Payer: Medicare Other | Admitting: Family Medicine

## 2015-07-21 ENCOUNTER — Encounter: Payer: Self-pay | Admitting: Family Medicine

## 2015-07-21 VITALS — BP 130/64 | HR 70 | Temp 98.8°F | Wt 169.0 lb

## 2015-07-21 DIAGNOSIS — S61412A Laceration without foreign body of left hand, initial encounter: Secondary | ICD-10-CM | POA: Diagnosis not present

## 2015-07-21 MED ORDER — CEPHALEXIN 500 MG PO CAPS: 500.0000 mg | ORAL_CAPSULE | Freq: Two times a day (BID) | ORAL | Status: DC

## 2015-07-21 NOTE — Progress Notes (Signed)
[name], MD  Subjective:  Hector Pratt is a 82 y.o. year old very pleasant male patient who presents for/with See problem oriented charting ROS- level 5 caveat due to likely dementia.   Past Medical History-  Patient Active Problem List   Diagnosis Date Noted  . DNR (do not resuscitate) 12/28/2014    Priority: High  . Memory loss 09/14/2014    Priority: Medium  . Bradycardia 11/11/2013    Priority: Medium  . Actinic keratosis 11/11/2013    Priority: Low  . Slow transit constipation 11/04/2010    Priority: Low  . Essential hypertension 10/30/2007    Priority: Low  . Glaucoma 01/03/2007    Priority: Low  . Venous (peripheral) insufficiency 01/03/2007    Priority: Low  . Hyperlipidemia 01/02/2007    Priority: Low  . PROSTATE CANCER, HX OF 01/02/2007    Priority: Low  . Neoplasm of uncertain behavior of skin 08/28/2014    Medications- reviewed and updated Current Outpatient Prescriptions  Medication Sig Dispense Refill  . carteolol (OCUPRESS) 1 % ophthalmic solution Place 0.5 mLs into both eyes 2 (two) times daily.     . TRAVATAN Z 0.004 % SOLN ophthalmic solution      No current facility-administered medications for this visit.    Objective: BP 130/64 mmHg  Pulse 70  Temp(Src) 98.8 F (37.1 C)  Wt 169 lb (76.658 kg) Gen: NAD, resting comfortably on table CV: RRR no murmurs rubs or gallops Lungs: CTAB no crackles, wheeze, rhonchi Ext: no edema Skin: warm, dry Neuro: grossly normal, moves all extremities, intact distal sensation  8.5 cm wound on left palm. 8 sutures in place. Removed all 8. There is a small area of separation without purulence in mid portion of wound about 2 cm. Mild swelling around entire wound with only very mild erythema.   Assessment/Plan:  Left hand laceration S: Fall on 07/11/15. Per ED note which was reviewed was caused by getting tangled up in clothes and falling backwards. Fingernails seemed to rip through skin on left palm.  Struck his head but no LOC. UTD on tetanus as of 2012. No fingernail found in wound exploration. Some tendon was exposed on objective but asessemnt and plan state no noted tendon injury 8 simple interrupted sutures were applied. Head CT was done with no acut efindings. X-ray hand no acute findings. CT neck no acute findings.  A/P: Removed 8 sutures from 8.5 cm wound. 2 cm central portion has not fully pulled together. We will cover with keflex for 10 days and continue dressing changes for 2 weeks. Return precautions given. Has had poor follow up so necessary to remove. We also reviewed ED findings and reassuring head CT and neck CT- has not had any obvious complaints about headache- doubt subdural  Also advised to call to get MRI then follow up about memory loss after MRI. Return precautions advised.   Meds ordered this encounter  Medications  . cephALEXin (KEFLEX) 500 MG capsule    Sig: Take 1 capsule (500 mg total) by mouth 2 (two) times daily.    Dispense:  20 capsule    Refill:  0  high risk wound/laceration given patients dementia and location of wound.

## 2015-07-21 NOTE — Patient Instructions (Addendum)
Left hand cut Of the 8.5 cm original wound, there is now a 2 cm section that has not completely healed. We will continue prior dressing changes until this has fully closed (may take another 2 weeks or so).   Also cover with antibiotics once a day for 10 days due to high risk wound though does not appear acutely infected  If expanding redness, swelling, pus comes from wound- return for visit.   Also advise a follow up for memory loss- see me a week or two after you have the MRI done. [phone] to schedule the MRI we had previously discussed.

## 2015-07-21 NOTE — Telephone Encounter (Signed)
I called [facility] and spoke with Sherlynn Stalls (nurse), she stated Hector Pratt is gone for the day and I informed her of the message below.  She stated they have already started the pt on Keflex twice a day since this is what was on the Rx.

## 2015-07-21 NOTE — Telephone Encounter (Signed)
Elmyra Ricks at [facility] called to get clarification on the rx cephALEXin (KEFLEX) 500 MG capsule. Instructions on paperwork sent home states abx 1 X a day for 10 days. But RX sent to pharmacy states BID for 10 days. [facility] will send a fax for dr signature as well.

## 2015-07-21 NOTE — Telephone Encounter (Signed)
Should be twice a day. My apologies- reviewed AVS and noted where I wrote that.

## 2015-07-22 ENCOUNTER — Telehealth: Payer: Self-pay | Admitting: Family Medicine

## 2015-07-22 ENCOUNTER — Emergency Department (HOSPITAL_COMMUNITY): Payer: Medicare Other

## 2015-07-22 ENCOUNTER — Encounter (HOSPITAL_COMMUNITY): Payer: Self-pay

## 2015-07-22 ENCOUNTER — Emergency Department (HOSPITAL_COMMUNITY)
Admission: EM | Admit: 2015-07-22 | Discharge: 2015-07-22 | Disposition: A | Discharge status: Home / Self Care | Payer: Medicare Other | Attending: Emergency Medicine | Admitting: Emergency Medicine

## 2015-07-22 DIAGNOSIS — Z79899 Other long term (current) drug therapy: Secondary | ICD-10-CM | POA: Diagnosis not present

## 2015-07-22 DIAGNOSIS — R410 Disorientation, unspecified: Secondary | ICD-10-CM | POA: Diagnosis not present

## 2015-07-22 DIAGNOSIS — Z792 Long term (current) use of antibiotics: Secondary | ICD-10-CM | POA: Diagnosis not present

## 2015-07-22 DIAGNOSIS — Z87828 Personal history of other (healed) physical injury and trauma: Secondary | ICD-10-CM | POA: Insufficient documentation

## 2015-07-22 DIAGNOSIS — R404 Transient alteration of awareness: Secondary | ICD-10-CM | POA: Diagnosis not present

## 2015-07-22 DIAGNOSIS — Z8546 Personal history of malignant neoplasm of prostate: Secondary | ICD-10-CM | POA: Insufficient documentation

## 2015-07-22 DIAGNOSIS — Z8639 Personal history of other endocrine, nutritional and metabolic disease: Secondary | ICD-10-CM | POA: Diagnosis not present

## 2015-07-22 DIAGNOSIS — Z8739 Personal history of other diseases of the musculoskeletal system and connective tissue: Secondary | ICD-10-CM | POA: Diagnosis not present

## 2015-07-22 DIAGNOSIS — H409 Unspecified glaucoma: Secondary | ICD-10-CM | POA: Diagnosis not present

## 2015-07-22 DIAGNOSIS — F919 Conduct disorder, unspecified: Secondary | ICD-10-CM | POA: Insufficient documentation

## 2015-07-22 DIAGNOSIS — R4182 Altered mental status, unspecified: Secondary | ICD-10-CM | POA: Diagnosis present

## 2015-07-22 DIAGNOSIS — Z87891 Personal history of nicotine dependence: Secondary | ICD-10-CM | POA: Insufficient documentation

## 2015-07-22 DIAGNOSIS — I10 Essential (primary) hypertension: Secondary | ICD-10-CM | POA: Diagnosis not present

## 2015-07-22 LAB — CBC WITH DIFFERENTIAL/PLATELET
BASOS ABS: 0 10*3/uL (ref 0.0–0.1)
BASOS PCT: 0 %
EOS ABS: 0 10*3/uL (ref 0.0–0.7)
Eosinophils Relative: 0 %
HCT: 37.4 % — ABNORMAL LOW (ref 39.0–52.0)
HEMOGLOBIN: 12.7 g/dL — AB (ref 13.0–17.0)
Lymphocytes Relative: 10 %
Lymphs Abs: 1.2 10*3/uL (ref 0.7–4.0)
MCH: 33 pg (ref 26.0–34.0)
MCHC: 34 g/dL (ref 30.0–36.0)
MCV: 97.1 fL (ref 78.0–100.0)
Monocytes Absolute: 1.4 10*3/uL — ABNORMAL HIGH (ref 0.1–1.0)
Monocytes Relative: 12 %
NEUTROS ABS: 8.9 10*3/uL — AB (ref 1.7–7.7)
NEUTROS PCT: 78 %
Platelets: 268 10*3/uL (ref 150–400)
RBC: 3.85 MIL/uL — AB (ref 4.22–5.81)
RDW: 13.3 % (ref 11.5–15.5)
WBC: 11.6 10*3/uL — AB (ref 4.0–10.5)

## 2015-07-22 LAB — COMPREHENSIVE METABOLIC PANEL
ALBUMIN: 3.6 g/dL (ref 3.5–5.0)
ALK PHOS: 51 U/L (ref 38–126)
ALT: 15 U/L — AB (ref 17–63)
AST: 20 U/L (ref 15–41)
Anion gap: 9 (ref 5–15)
BUN: 23 mg/dL — ABNORMAL HIGH (ref 6–20)
CALCIUM: 8.7 mg/dL — AB (ref 8.9–10.3)
CO2: 25 mmol/L (ref 22–32)
CREATININE: 0.9 mg/dL (ref 0.61–1.24)
Chloride: 106 mmol/L (ref 101–111)
Glucose, Bld: 130 mg/dL — ABNORMAL HIGH (ref 65–99)
Potassium: 3.8 mmol/L (ref 3.5–5.1)
Sodium: 140 mmol/L (ref 135–145)
TOTAL PROTEIN: 7 g/dL (ref 6.5–8.1)
Total Bilirubin: 1.3 mg/dL — ABNORMAL HIGH (ref 0.3–1.2)

## 2015-07-22 LAB — I-STAT TROPONIN, ED: TROPONIN I, POC: 0.02 ng/mL (ref 0.00–0.08)

## 2015-07-22 LAB — URINALYSIS, ROUTINE W REFLEX MICROSCOPIC
BILIRUBIN URINE: NEGATIVE
Glucose, UA: NEGATIVE mg/dL
HGB URINE DIPSTICK: NEGATIVE
KETONES UR: NEGATIVE mg/dL
Leukocytes, UA: NEGATIVE
Nitrite: NEGATIVE
Protein, ur: NEGATIVE mg/dL
SPECIFIC GRAVITY, URINE: 1.021 (ref 1.005–1.030)
pH: 7.5 (ref 5.0–8.0)

## 2015-07-22 LAB — I-STAT CG4 LACTIC ACID, ED: Lactic Acid, Venous: 1.65 mmol/L (ref 0.5–2.0)

## 2015-07-22 NOTE — ED Provider Notes (Signed)
CSN: [id number]     Arrival date & time 07/22/15  1321 History   First MD Initiated Contact with Patient 07/22/15 1336     Chief Complaint  Patient presents with  . Fatigue    AMS AND LETHARGIC SINCE LAST NIGHT     (Consider location/radiation/quality/duration/timing/severity/associated sxs/prior Treatment) Patient is a 82 y.o. male presenting with altered mental status. The history is provided by the nursing home and a relative.  Altered Mental Status Presenting symptoms: combativeness, confusion and disorientation   Severity:  Moderate Most recent episode:  Yesterday Episode history:  Single Duration:  2 hours Timing:  Constant Progression:  Resolved Chronicity:  New Context: dementia and nursing home resident   Context comment:  Recent fall on outstretched hand with laceration Associated symptoms: fever (not on arrival)   Associated symptoms: no hallucinations, no slurred speech and no vomiting     Past Medical History  Diagnosis Date  . HYPERLIPIDEMIA 01/02/2007  . DEPRESSION 01/02/2007    patient denies history  . GLAUCOMA NOS 01/03/2007  . CERUMEN IMPACTION 09/01/2008  . ACUT SUPPRATV OTITIS MEDIA W/O SPONT RUP EARDRUM 09/01/2008    patient denies rupture  . HYPERTENSION 10/30/2007  . VENOUS INSUFFICIENCY, CHRONIC 01/03/2007  . ANKLE INJURY 02/28/2010  . PROSTATE CANCER, HX OF 01/02/2007  . DJD (degenerative joint disease)    Past Surgical History  Procedure Laterality Date  . Colonoscopy    . Lymph node removal      associated with prostate cancer   Family History  Problem Relation Age of Onset  . Alcohol abuse Other   . Cancer Other 50    relative  . Stroke Other 50    relative  . Prostate cancer Other     1st degree relative <50   Social History  Substance Use Topics  . Smoking status: Former Smoker -- 1.00 packs/day for 19 years    Types: Cigarettes    Quit date: 10/04/1957  . Smokeless tobacco: Never Used  . Alcohol Use: No    Review of Systems   Constitutional: Positive for fever (not on arrival).  Gastrointestinal: Negative for vomiting.  Psychiatric/Behavioral: Positive for confusion. Negative for hallucinations.  All other systems reviewed and are negative.     Allergies  Review of patient's allergies indicates no known allergies.  Home Medications   Prior to Admission medications   Medication Sig Start Date End Date Taking? Authorizing Provider  carteolol (OCUPRESS) 1 % ophthalmic solution Place 1 drop into both eyes 2 (two) times daily.    Yes Historical Provider, MD  cephALEXin (KEFLEX) 500 MG capsule Take 1 capsule (500 mg total) by mouth 2 (two) times daily. 07/21/15  Yes Marin Olp, MD  TRAVATAN Z 0.004 % SOLN ophthalmic solution Place 1 drop into both eyes at bedtime.  11/02/14  Yes Historical Provider, MD   BP 154/77 mmHg  Pulse 60  Temp(Src) 99.7 F (37.6 C) (Rectal)  Resp 16  SpO2 94% Physical Exam  Constitutional: He appears well-developed and well-nourished. No distress.  HENT:  Head: Normocephalic and atraumatic.  Eyes: Conjunctivae are normal.  Neck: Neck supple. No tracheal deviation present.  Cardiovascular: Normal rate, regular rhythm and normal heart sounds.   Pulmonary/Chest: Effort normal and breath sounds normal. No respiratory distress.  Abdominal: Soft. He exhibits no distension. There is no tenderness.  Neurological: He is alert. He is not disoriented (patient knows location and time of day, not month). GCS eye subscore is 4. GCS verbal subscore is  5. GCS motor subscore is 6.  Skin: Skin is warm and dry.  Psychiatric: He has a normal mood and affect.    ED Course  Procedures (including critical care time) Labs Review Labs Reviewed  COMPREHENSIVE METABOLIC PANEL - Abnormal; Notable for the following:    Glucose, Bld 130 (*)    BUN 23 (*)    Calcium 8.7 (*)    ALT 15 (*)    Total Bilirubin 1.3 (*)    All other components within normal limits  CBC WITH DIFFERENTIAL/PLATELET -  Abnormal; Notable for the following:    WBC 11.6 (*)    RBC 3.85 (*)    Hemoglobin 12.7 (*)    HCT 37.4 (*)    Neutro Abs 8.9 (*)    Monocytes Absolute 1.4 (*)    All other components within normal limits  URINALYSIS, ROUTINE W REFLEX MICROSCOPIC (NOT AT [facility]) - Abnormal; Notable for the following:    APPearance CLOUDY (*)    All other components within normal limits  CULTURE, BLOOD (ROUTINE X 2)  CULTURE, BLOOD (ROUTINE X 2)  URINE CULTURE  I-STAT CG4 LACTIC ACID, ED  Randolm Idol, ED    Imaging Review Dg Chest Port 1 View  07/22/2015  CLINICAL DATA:  Low-grade fever and altered mental status EXAM: PORTABLE CHEST 1 VIEW COMPARISON:  01/17/2014 FINDINGS: Cardiac shadow is stable. The lungs are well aerated bilaterally. No focal infiltrate or sizable effusion is seen. Chronic changes in the right shoulder are noted. No acute bony abnormality is seen. IMPRESSION: No acute abnormality noted. Electronically Signed   By: Inez Catalina M.D.   On: 07/22/2015 14:55   I have personally reviewed and evaluated these images and lab results as part of my medical decision-making.   EKG Interpretation   Date/Time:  Thursday July 22 2015 13:35:09 EDT Ventricular Rate:  61 PR Interval:  213 QRS Duration: 105 QT Interval:  429 QTC Calculation: 432 R Axis:   -51 Text Interpretation:  Sinus rhythm Borderline prolonged PR interval Left  anterior fascicular block Anterior infarct, old Minimal ST depression,  lateral leads No previous tracing Confirmed by Zyiere Rosemond MD, Naudia Crosley [id number]) on  07/22/2015 2:34:34 PM      MDM   Final diagnoses:  Transient alteration of awareness    82 y.o. male presents with Some transient confusion this morning from the nursing facility. On arrival here he is oriented to person place and time but is unable to tell me the appropriate month. He is not febrile but received Tylenol in route with EMS. He does have some slight leukocytosis but technically has no SIRS  criteria. Chest x-ray negative for pneumonia. I discussed lab findings with family members at bedside who expressed understanding that it was no bacterial infection identified during today's visit but that he should be monitored closely for changes in his clinical status and return immediately to the emergency Department with any worsening. At this time the risk of hospitalization at his advanced age is greater than the benefit.     Leo Grosser, MD 07/22/15 [phone]

## 2015-07-22 NOTE — Telephone Encounter (Signed)
nicole with [facility] called to advise they believe pt is having a reaction to the cephALEXin (KEFLEX) 500 MG capsule Pt is flushed, confused, urinating on the floor, one shoe off, one shoe on, and this is not his norm. Elmyra Ricks is not with the patient, but states someone is sitting with him. They did hold the keflex this morning, due to the nature of pt's behavior. Would like to know what Dr hunter advises.

## 2015-07-22 NOTE — Telephone Encounter (Signed)
Spoke with Elmyra Ricks and per Dr. Yong Channel pt needs to go to ER.

## 2015-07-22 NOTE — ED Notes (Signed)
Bed: [id number] Expected date:  Expected time:  Means of arrival:  Comments: EMS- 82yo M, AMS/lethargic

## 2015-07-22 NOTE — ED Notes (Signed)
PT RECEIVED VIA EMS SENT FROM [facility] FOR A LOW GRADE FEVER, AMS, AND LETHARGY SINCE LAST NIGHT.

## 2015-07-22 NOTE — ED Notes (Signed)
PT DISCHARGED. INSTRUCTIONS GIVEN TO PTAR STAFF. AAOX2. PT IN NO APPARENT DISTRESS OR PAIN. THE OPPORTUNITY TO ASK QUESTIONS WAS PROVIDED. 

## 2015-07-22 NOTE — Telephone Encounter (Signed)
2 options considering behavior change. In long run, is there an option for him to transition to someone that can evaluate him at the nursing home such as [facility]? I know it is difficult for family to get patient here  1. Come here at 4:15 would get bloodwork and urine and do exam to see if we can find cause of his behavior change. Would be 4:30 slot but tends to arrive late.  2. Go to emergency room  We can hold the keflex until evaluation- he did not have clear infection yesterday.

## 2015-07-22 NOTE — Discharge Instructions (Signed)
Fever, Adult A fever is an increase in the body's temperature. It is usually defined as a temperature of 100F (38C) or higher. Brief mild or moderate fevers generally have no long-term effects, and they often do not require treatment. Moderate or high fevers may make you feel uncomfortable and can sometimes be a sign of a serious illness or disease. The sweating that may occur with repeated or prolonged fever may also cause dehydration. Fever is confirmed by taking a temperature with a thermometer. A measured temperature can vary with:  Age.  Time of day.  Location of the thermometer:  Mouth (oral).  Rectum (rectal).  Ear (tympanic).  Underarm (axillary).  Forehead (temporal). HOME CARE INSTRUCTIONS Pay attention to any changes in your symptoms. Take these actions to help with your condition:  Take over-the counter and prescription medicines only as told by your health care provider. Follow the dosing instructions carefully.  If you were prescribed an antibiotic medicine, take it as told by your health care provider. Do not stop taking the antibiotic even if you start to feel better.  Rest as needed.  Drink enough fluid to keep your urine clear or pale yellow. This helps to prevent dehydration.  Sponge yourself or bathe with room-temperature water to help reduce your body temperature as needed. Do not use ice water.  Do not overbundle yourself in blankets or heavy clothes. SEEK MEDICAL CARE IF:  You vomit.  You cannot eat or drink without vomiting.  You have diarrhea.  You have pain when you urinate.  Your symptoms do not improve with treatment.  You develop new symptoms.  You develop excessive weakness. SEEK IMMEDIATE MEDICAL CARE IF:  You have shortness of breath or have trouble breathing.  You are dizzy or you faint.  You are disoriented or confused.  You develop signs of dehydration, such as a dry mouth, decreased urination, or paleness.  You develop  severe pain in your abdomen.  You have persistent vomiting or diarrhea.  You develop a skin rash.  Your symptoms suddenly get worse.   This information is not intended to replace advice given to you by your health care provider. Make sure you discuss any questions you have with your health care provider.   Document Released: 09/20/2000 Document Revised: 12/16/2014 Document Reviewed: 05/21/2014 Elsevier Interactive Patient Education 2016 [employer].  There was no bacterial source of infection identified during the emergency department visit today. He is back to his mental status baseline currently. This may represent an early viral illness or other unrecognized infection. If this patient were to experience any worsening confusion, fevers, difficulty breathing, abdominal pain, or other concerning symptoms they should return to the emergency department for repeat evaluation. Otherwise the patient should follow up closely with his primary care provider.

## 2015-07-23 ENCOUNTER — Other Ambulatory Visit: Payer: Self-pay | Admitting: Family Medicine

## 2015-07-23 ENCOUNTER — Ambulatory Visit
Admission: RE | Admit: 2015-07-23 | Discharge: 2015-07-23 | Disposition: A | Discharge status: Home / Self Care | Payer: Medicare Other | Source: Ambulatory Visit | Attending: Family Medicine | Admitting: Family Medicine

## 2015-07-23 DIAGNOSIS — R413 Other amnesia: Secondary | ICD-10-CM

## 2015-07-23 MED ORDER — GADOBENATE DIMEGLUMINE 529 MG/ML IV SOLN
15.0000 mL | Freq: Once | INTRAVENOUS | Status: DC | PRN
Start: 2015-07-23 — End: 2015-07-24

## 2015-07-24 LAB — URINE CULTURE: Culture: NO GROWTH

## 2015-07-27 ENCOUNTER — Other Ambulatory Visit: Payer: Self-pay | Admitting: Family Medicine

## 2015-07-27 DIAGNOSIS — R413 Other amnesia: Secondary | ICD-10-CM

## 2015-07-27 LAB — CULTURE, BLOOD (ROUTINE X 2)
CULTURE: NO GROWTH
CULTURE: NO GROWTH

## 2015-08-20 ENCOUNTER — Ambulatory Visit: Payer: Medicare Other | Admitting: Neurology

## 2015-08-24 ENCOUNTER — Telehealth: Payer: Self-pay | Admitting: Family Medicine

## 2015-08-24 NOTE — Telephone Encounter (Signed)
Pt daughter called and would like a call back     [phone]

## 2015-08-24 NOTE — Telephone Encounter (Signed)
Left message for daughter Siri Cole to call back.

## 2015-08-25 NOTE — Telephone Encounter (Signed)
FYI Abbie  LVM with daughter to call back- can forward her to me if I am still here when calls back but will be gone at some point this afternoon- she can call back another day

## 2015-08-25 NOTE — Telephone Encounter (Signed)
Spoke with patients daughter Siri Cole. She would like an explanation of MRI test results. They looked at results on mychart but were unable to understand them. They are very concerned about patients cognitive decline. Also patient was scheduled to see Dr. Tomi Likens on May 12th and [facility] cancelled this appointment. Now family can't get patient in until some time in July. Needless to say daughter is very frustrated with the way things are going right now.

## 2015-08-27 NOTE — Telephone Encounter (Signed)
Called and spoke with Mrs. Nickolas Madrid (pt's daughter).  Explained MRI and Dr. Ronney Lion dictation note.  The nursing home missed getting the patient to his neurology appointment due to a mix-up with transportation.  The appointment was rescheduled but isn't until July 7th.  Patient's daughter would like to know if there is anything Dr. Yong Channel can do to get him in sooner?  I advised her there probably wasn't much we could do but would ask. Dr. Yong Channel. Please have CMA call Mrs. Funk and let her know if there is anything you feel like we could do on our end to expedite that consult.

## 2015-08-27 NOTE — Telephone Encounter (Signed)
Hector Pratt would like a call back before the weekend. Thank you.

## 2015-08-27 NOTE — Telephone Encounter (Signed)
Called daughter again and did not pick up. LVM to call back. Not sure what else I can do- second time I have called.

## 2015-09-02 ENCOUNTER — Telehealth: Payer: Self-pay | Admitting: Family Medicine

## 2015-09-02 NOTE — Telephone Encounter (Signed)
MR faxed.

## 2015-09-02 NOTE — Telephone Encounter (Signed)
absolutely

## 2015-09-02 NOTE — Telephone Encounter (Signed)
Hector Pratt with [facility] would like to know if Dr Yong Channel will send the  results of pt's MRI to them.  Fax  [phone]

## 2015-09-02 NOTE — Telephone Encounter (Signed)
Ok to send

## 2015-09-03 ENCOUNTER — Telehealth: Payer: Self-pay | Admitting: Pediatrics

## 2015-09-03 NOTE — Telephone Encounter (Signed)
Per faxed note from [place], LPN: Wife reports resident is up frequently at night wandering and using the bathroom. Please advise.

## 2015-09-03 NOTE — Telephone Encounter (Signed)
Can you call pt to schedule office visit? See below note.

## 2015-09-03 NOTE — Telephone Encounter (Signed)
Lets schedule a visit to discuss options

## 2015-09-07 NOTE — Telephone Encounter (Signed)
Faxed to Nelson, LPN @ [facility] as FYI per Dr. Yong Channel.

## 2015-09-07 NOTE — Telephone Encounter (Signed)
Glad no further wandering. Can fax them back this to confirm I have seen below note from [facility].

## 2015-09-07 NOTE — Telephone Encounter (Signed)
Hector Pratt at [facility] states this message was just to let the doctor know what was going on. (an Hector Pratt) Pt has appt with neuro on July 5.  Pt missed his first appt on 5/12 because the wife states they knew nothing about and was not going. Hector Pratt has spoken to the daughter, who is aware and pt will keep his next appt with Dr Tomi Likens. Pt is also on the wait list for a sooner appt with neuro.  Hector Pratt states there has been no other reports of pt wandering or up at night. Hector Pratt would like a fax back confirming the doctor is aware they sent this message.

## 2015-09-14 DIAGNOSIS — R262 Difficulty in walking, not elsewhere classified: Secondary | ICD-10-CM | POA: Diagnosis not present

## 2015-09-14 DIAGNOSIS — R2681 Unsteadiness on feet: Secondary | ICD-10-CM | POA: Diagnosis not present

## 2015-09-14 DIAGNOSIS — M6281 Muscle weakness (generalized): Secondary | ICD-10-CM | POA: Diagnosis not present

## 2015-09-14 DIAGNOSIS — M25511 Pain in right shoulder: Secondary | ICD-10-CM | POA: Diagnosis not present

## 2015-09-14 DIAGNOSIS — R2689 Other abnormalities of gait and mobility: Secondary | ICD-10-CM | POA: Diagnosis not present

## 2015-09-20 DIAGNOSIS — M25511 Pain in right shoulder: Secondary | ICD-10-CM | POA: Diagnosis not present

## 2015-09-20 DIAGNOSIS — R262 Difficulty in walking, not elsewhere classified: Secondary | ICD-10-CM | POA: Diagnosis not present

## 2015-09-20 DIAGNOSIS — R2681 Unsteadiness on feet: Secondary | ICD-10-CM | POA: Diagnosis not present

## 2015-09-20 DIAGNOSIS — M6281 Muscle weakness (generalized): Secondary | ICD-10-CM | POA: Diagnosis not present

## 2015-09-20 DIAGNOSIS — R2689 Other abnormalities of gait and mobility: Secondary | ICD-10-CM | POA: Diagnosis not present

## 2015-09-27 DIAGNOSIS — M25511 Pain in right shoulder: Secondary | ICD-10-CM | POA: Diagnosis not present

## 2015-09-27 DIAGNOSIS — R262 Difficulty in walking, not elsewhere classified: Secondary | ICD-10-CM | POA: Diagnosis not present

## 2015-09-27 DIAGNOSIS — M6281 Muscle weakness (generalized): Secondary | ICD-10-CM | POA: Diagnosis not present

## 2015-09-27 DIAGNOSIS — R2681 Unsteadiness on feet: Secondary | ICD-10-CM | POA: Diagnosis not present

## 2015-09-27 DIAGNOSIS — R2689 Other abnormalities of gait and mobility: Secondary | ICD-10-CM | POA: Diagnosis not present

## 2015-10-13 ENCOUNTER — Ambulatory Visit (INDEPENDENT_AMBULATORY_CARE_PROVIDER_SITE_OTHER): Payer: Medicare Other | Admitting: Neurology

## 2015-10-13 ENCOUNTER — Encounter: Payer: Self-pay | Admitting: Neurology

## 2015-10-13 VITALS — BP 146/62 | HR 58 | Wt 170.0 lb

## 2015-10-13 DIAGNOSIS — I679 Cerebrovascular disease, unspecified: Secondary | ICD-10-CM | POA: Diagnosis not present

## 2015-10-13 DIAGNOSIS — H9193 Unspecified hearing loss, bilateral: Secondary | ICD-10-CM

## 2015-10-13 DIAGNOSIS — R4189 Other symptoms and signs involving cognitive functions and awareness: Secondary | ICD-10-CM

## 2015-10-13 DIAGNOSIS — Q283 Other malformations of cerebral vessels: Secondary | ICD-10-CM | POA: Diagnosis not present

## 2015-10-13 NOTE — Progress Notes (Addendum)
NEUROLOGY CONSULTATION NOTE  Hector Pratt MRN: [id number] DOB: Aug 28, 1919  Referring provider: Dr. Yong Channel Primary care provider: Dr. Yong Channel  Reason for consult:  Memory loss  HISTORY OF PRESENT ILLNESS: Hector Pratt is a 82 year old man with hypertension, hyperlipidemia, and history of prostate cancer who presents for memory problems.  Patient is a poor historian, particularly due to hearing loss.  Unfortunately, he is not accompanied by anybody familiar with him.  Some history is obtained by ED notes and PCP notes.  He resides in a retirement home-independent living at [facility] with his wife (assisted living).  He retired in 1989 from [employer].  He worked in [place].  He has had memory problems for at least a year based on prior medical notes. He cannot elaborate, but reports short term memory problems such as misplacing belongings. I don't believe his is agitated or combative.  Often, he will have intermittent episodes of confusion.  Most recently, he was seen in the ED on 07/22/15 for altered mental status.  CBC did show slight leukocytosis with WBC of 11.6, but nothing significant.  UA and CXR were negative for UTI and pneumonia.  He was afebrile.  MRI of the brain showed advanced atrophy with extensive chronic small vessel ischemic changes, as well as incidental left frontal cavernoma with tiny focus of subacute and chronic hemorrhage.  He denies family history of dementia.  He denies prior history of stroke.  He does not drive.  He uses a walker to ambulate.  He denies recent falls.  TSH from 11/23/14 was 1.31.  PAST MEDICAL HISTORY: Past Medical History  Diagnosis Date  . HYPERLIPIDEMIA 01/02/2007  . DEPRESSION 01/02/2007    patient denies history  . GLAUCOMA NOS 01/03/2007  . CERUMEN IMPACTION 09/01/2008  . ACUT SUPPRATV OTITIS MEDIA W/O SPONT RUP EARDRUM 09/01/2008    patient denies rupture  . HYPERTENSION 10/30/2007  . VENOUS INSUFFICIENCY, CHRONIC 01/03/2007  .  ANKLE INJURY 02/28/2010  . PROSTATE CANCER, HX OF 01/02/2007  . DJD (degenerative joint disease)   . Bradycardia   . Memory loss     PAST SURGICAL HISTORY: Past Surgical History  Procedure Laterality Date  . Colonoscopy    . Lymph node removal      associated with prostate cancer    MEDICATIONS: Current Outpatient Prescriptions on File Prior to Visit  Medication Sig Dispense Refill  . carteolol (OCUPRESS) 1 % ophthalmic solution Place 1 drop into both eyes 2 (two) times daily.     . polyvinyl alcohol (LIQUIFILM TEARS) 1.4 % ophthalmic solution Place 1 drop into both eyes 4 (four) times daily as needed for dry eyes.    . TRAVATAN Z 0.004 % SOLN ophthalmic solution Place 1 drop into both eyes at bedtime.      No current facility-administered medications on file prior to visit.    ALLERGIES: No Known Allergies  FAMILY HISTORY: Family History  Problem Relation Age of Onset  . Alcohol abuse Other   . Cancer Other 50    relative  . Stroke Other 50    relative  . Prostate cancer Other     1st degree relative <50    SOCIAL HISTORY: Social History   Social History  . Marital Status: Married    Spouse Name: N/A  . Number of Children: N/A  . Years of Education: N/A   Occupational History  . Retired    Social History Main Topics  . Smoking status: Former Smoker --  1.00 packs/day for 19 years    Types: Cigarettes    Quit date: 10/04/1957  . Smokeless tobacco: Never Used  . Alcohol Use: No  . Drug Use: No  . Sexual Activity: Yes   Other Topics Concern  . Not on file   Social History Narrative   Retired in 1989 from [employer] to [employer] in 89 from [place] (10 years)   Lives with wife. Lives in retirement home-independent living [place].    Does all ADLS and IADLs in [phone].    Married 25 years, lost 1st wife due to cancer. 2 children, wife has 1 children. Son worked for [employer] and lived 3 years ago. 1 grandkid on wife's side, 2 on  her side. No great grandkids.       Hobbies: used to play golf until recently, watch tv    REVIEW OF SYSTEMS: Constitutional: No fevers, chills, or sweats, no generalized fatigue, change in appetite Eyes: No visual changes, double vision, eye pain Ear, nose and throat: No hearing loss, ear pain, nasal congestion, sore throat Cardiovascular: No chest pain, palpitations Respiratory:  No shortness of breath at rest or with exertion, wheezes GastrointestinaI: No nausea, vomiting, diarrhea, abdominal pain, fecal incontinence Genitourinary:  No dysuria, urinary retention or frequency Musculoskeletal:  No neck pain, back pain Integumentary: No rash, pruritus, skin lesions Neurological: as above Psychiatric: No depression, insomnia, anxiety Endocrine: No palpitations, fatigue, diaphoresis, mood swings, change in appetite, change in weight, increased thirst Hematologic/Lymphatic:  No purpura, petechiae. Allergic/Immunologic: no itchy/runny eyes, nasal congestion, recent allergic reactions, rashes  PHYSICAL EXAM: Filed Vitals:   10/13/15 1019  BP: 146/62  Pulse: 58   General: No acute distress.  Patient appears well-groomed.  Head:  Normocephalic/atraumatic Eyes:  fundi examined but not visualized Neck: supple, no paraspinal tenderness, full range of motion Back: No paraspinal tenderness Heart: regular rate and rhythm Lungs: Clear to auscultation bilaterally. Vascular: No carotid bruits. Neurological Exam: Mental status: Unable to complete MMSE due to hard of hearing.  Alert and oriented to person and place but not time (he said it was Monday, February, 2016 and Spring), recalled 1 of 3 words, remote memory intact, fund of knowledge fairly intact, attention and concentration intact, speech fluent and not dysarthric, language intact.  Has trouble following complex commands, but cannot tell if may be due to hearing loss. Cranial nerves: CN I: not tested CN II: pupils equal, round and  reactive to light, visual fields intact CN III, IV, VI:  full range of motion, no nystagmus, no ptosis CN V: facial sensation intact CN VII: upper and lower face symmetric CN VIII: hearing intact CN IX, X: gag intact, uvula midline CN XI: sternocleidomastoid and trapezius muscles intact CN XII: tongue midline Bulk & Tone: normal, no fasciculations. Motor:  5/5 throughout  Sensation: temperature and vibration sensation intact. Deep Tendon Reflexes:  2+ throughout, toes downgoing. Finger to nose testing:  Without dysmetria.  Gait:  Slow station and stride with reduced step  Unable to tandem walk.  Romberg negative  IMPRESSION: 1.  Cognitive impairment.  Probably mild dementia given intermittent episodes of confusion.  Vascular etiology likely.  Cannot tell if there is an underlying neurodegenerative component due to poor history.  Regardless, I don't think there is anything that would change management.  I think side effects of cholinesterase inhibitors would outweigh benefits. 2.  HTN 3.  Cerebral cavernoma.  There isn't anything to be done for this.  PLAN: 1.  I would focus on safety.  He lives in an assisted living facility so routine safety evaluations should be made in the home. 2.  Use of cane at all times. 3.  BP slightly elevated.  Should be monitored by PCP. 4.  Consider checking B12. 5.  He should have his hearing checked and fitted for hearing aids 6.  Follow up as needed.  Thank you for allowing me to take part in the care of this patient.  Metta Clines, DO  CC:  [name], MD

## 2015-10-13 NOTE — Patient Instructions (Signed)
You do demonstrate memory loss.  However, I don't think there is medication that would really be beneficial.  I would continue doing what you are doing.  The MRI of the brain shows evidence of a cavernoma, which is an incidental finding.  There is nothing to do about it.

## 2015-10-27 ENCOUNTER — Telehealth: Payer: Self-pay | Admitting: Family Medicine

## 2015-10-27 ENCOUNTER — Encounter: Payer: Self-pay | Admitting: Family Medicine

## 2015-10-27 ENCOUNTER — Ambulatory Visit (INDEPENDENT_AMBULATORY_CARE_PROVIDER_SITE_OTHER): Payer: Medicare Other | Admitting: Family Medicine

## 2015-10-27 VITALS — BP 162/92 | HR 58 | Temp 97.7°F | Wt 164.8 lb

## 2015-10-27 DIAGNOSIS — I10 Essential (primary) hypertension: Secondary | ICD-10-CM | POA: Diagnosis not present

## 2015-10-27 DIAGNOSIS — F0391 Unspecified dementia with behavioral disturbance: Secondary | ICD-10-CM | POA: Diagnosis not present

## 2015-10-27 DIAGNOSIS — R413 Other amnesia: Secondary | ICD-10-CM

## 2015-10-27 DIAGNOSIS — F03918 Unspecified dementia, unspecified severity, with other behavioral disturbance: Secondary | ICD-10-CM

## 2015-10-27 LAB — POC URINALSYSI DIPSTICK (AUTOMATED)
Bilirubin, UA: NEGATIVE
GLUCOSE UA: NEGATIVE
Ketones, UA: NEGATIVE
Nitrite, UA: POSITIVE
SPEC GRAV UA: 1.025
UROBILINOGEN UA: 0.2
pH, UA: 6

## 2015-10-27 LAB — CBC WITH DIFFERENTIAL/PLATELET
BASOS ABS: 0.1 10*3/uL (ref 0.0–0.1)
Basophils Relative: 0.9 % (ref 0.0–3.0)
EOS ABS: 0.3 10*3/uL (ref 0.0–0.7)
Eosinophils Relative: 5.7 % — ABNORMAL HIGH (ref 0.0–5.0)
HCT: 41.2 % (ref 39.0–52.0)
Hemoglobin: 13.8 g/dL (ref 13.0–17.0)
LYMPHS ABS: 1.3 10*3/uL (ref 0.7–4.0)
Lymphocytes Relative: 21 % (ref 12.0–46.0)
MCHC: 33.6 g/dL (ref 30.0–36.0)
MCV: 97.4 fl (ref 78.0–100.0)
MONO ABS: 0.9 10*3/uL (ref 0.1–1.0)
Monocytes Relative: 14.1 % — ABNORMAL HIGH (ref 3.0–12.0)
NEUTROS ABS: 3.6 10*3/uL (ref 1.4–7.7)
NEUTROS PCT: 58.3 % (ref 43.0–77.0)
PLATELETS: 271 10*3/uL (ref 150.0–400.0)
RBC: 4.23 Mil/uL (ref 4.22–5.81)
RDW: 14.3 % (ref 11.5–15.5)
WBC: 6.1 10*3/uL (ref 4.0–10.5)

## 2015-10-27 LAB — URINALYSIS, MICROSCOPIC ONLY

## 2015-10-27 LAB — VITAMIN B12: Vitamin B-12: 484 pg/mL (ref 211–911)

## 2015-10-27 MED ORDER — RISPERIDONE 0.25 MG PO TABS: 0.2500 mg | ORAL_TABLET | Freq: Every day | ORAL | Status: DC

## 2015-10-27 MED ORDER — LISINOPRIL 5 MG PO TABS: 5.0000 mg | ORAL_TABLET | Freq: Every day | ORAL | Status: DC

## 2015-10-27 NOTE — Progress Notes (Signed)
Subjective:  Hector Pratt is a 82 y.o. year old very pleasant male patient who presents for/with See problem oriented charting ROS- .see any ROS included in HPI as well.   Past Medical History-  Patient Active Problem List   Diagnosis Date Noted  . DNR (do not resuscitate) 12/28/2014    Priority: High  . Dementia with behavioral disturbance 09/14/2014    Priority: Medium  . Bradycardia 11/11/2013    Priority: Medium  . Actinic keratosis 11/11/2013    Priority: Low  . Slow transit constipation 11/04/2010    Priority: Low  . Essential hypertension 10/30/2007    Priority: Low  . Glaucoma 01/03/2007    Priority: Low  . Venous (peripheral) insufficiency 01/03/2007    Priority: Low  . Hyperlipidemia 01/02/2007    Priority: Low  . PROSTATE CANCER, HX OF 01/02/2007    Priority: Low  . Neoplasm of uncertain behavior of skin 08/28/2014    Medications- reviewed and updated Current Outpatient Prescriptions  Medication Sig Dispense Refill  . carteolol (OCUPRESS) 1 % ophthalmic solution Place 1 drop into both eyes 2 (two) times daily.     . polyvinyl alcohol (LIQUIFILM TEARS) 1.4 % ophthalmic solution Place 1 drop into both eyes 4 (four) times daily as needed for dry eyes.    . TRAVATAN Z 0.004 % SOLN ophthalmic solution Place 1 drop into both eyes at bedtime.     Marland Kitchen lisinopril (PRINIVIL,ZESTRIL) 5 MG tablet Take 1 tablet (5 mg total) by mouth daily. 30 tablet 5  . risperiDONE (RISPERDAL) 0.25 MG tablet Take 1 tablet (0.25 mg total) by mouth at bedtime. 30 tablet 5   No current facility-administered medications for this visit.    Objective: BP 162/92 mmHg  Pulse 58  Temp(Src) 97.7 F (36.5 C) (Oral)  Wt 164 lb 12.8 oz (74.753 kg)  SpO2 96% Gen: NAD, resting comfortably Oropharynx normal, TM normal except some wax obscurring on left CV: RRR no murmurs rubs or gallops Lungs: CTAB no crackles, wheeze, rhonchi Abdomen: soft/nontender/nondistended/normal bowel sounds. No rebound  or guarding.  Ext: 1+ edema Skin: warm, dry Neuro: grossly normal, moves all extremities, walks with walker  Assessment/Plan:   Dementia with behavioral disturbance S: symptoms worsening. Continued confusion. More difficult to talk to. has been confrontational with staff members daily over last few weeks seems to have worsened. Very uncooperative. Very confused most of the time. Will "fire" staff and then a few minutes will forget whole thing. ROS-  Level 5 caveat applies but per CNA with patient-no complaints of pain, appetite normal, no shortness of breath.  A/P: No cause for worsening confusion on exam. Get basic labs- possible UTI on UA- culture and micro sent. Start risperdal- discussed increased mortality risk with CNA but hopeful wife can come with patient to future visit so we can discuss. Also b12 through neuro notes. Has hearing aids- does not wear   Essential hypertension S: controlled without meds in past but 2/3 diastolic above Q000111Q and diastolci elevated tdoay BP Readings from Last 3 Encounters:  10/27/15 162/92  10/13/15 146/62  07/22/15 152/87  A/P: start lisinopril 5mg  with 3 week follow up.    3 weeks  Orders Placed This Encounter  Procedures  . Urine culture  . CBC with Differential/Platelet  . Vitamin B12  . Urine Microscopic  . POCT Urinalysis Dipstick (Automated)    Standing Status: Future     Number of Occurrences: 1     Standing Expiration Date: 10/26/2016  Meds ordered this encounter  Medications  . lisinopril (PRINIVIL,ZESTRIL) 5 MG tablet    Sig: Take 1 tablet (5 mg total) by mouth daily.    Dispense:  30 tablet    Refill:  5  . risperiDONE (RISPERDAL) 0.25 MG tablet    Sig: Take 1 tablet (0.25 mg total) by mouth at bedtime.    Dispense:  30 tablet    Refill:  5   The duration of face-to-face time during this visit was 25 minutes. Greater than 50% of this time was spent in counseling, explanation of diagnosis, planning of further management,  and/or coordination of care.     Return precautions advised.  [name], MD

## 2015-10-27 NOTE — Patient Instructions (Addendum)
Treat blood pressure with lisinopril 5mg - printed  Treat agitation with low dose risperdal 0.25mg - printed  Follow up 3 weeks with me but also appears has follow up with a  New primary care doctor- Dr. Nyoka Cowden so he could assume care  Labs before you leave

## 2015-10-27 NOTE — Telephone Encounter (Signed)
Enid Derry, nurse with [facility] would like to know if ok to do UA for pt at there facility and inform us of results? Pt instructed to drop off at [facility] tomorrow, but pt in assisted living and has no way to get it here.  Fax:  [phone]

## 2015-10-27 NOTE — Assessment & Plan Note (Signed)
S: controlled without meds in past but 2/3 diastolic above Q000111Q and diastolci elevated tdoay BP Readings from Last 3 Encounters:  10/27/15 162/92  10/13/15 146/62  07/22/15 152/87  A/P: start lisinopril 5mg  with 3 week follow up.

## 2015-10-27 NOTE — Progress Notes (Signed)
Pre visit review using our clinic review tool, if applicable. No additional management support is needed unless otherwise documented below in the visit note. 

## 2015-10-27 NOTE — Assessment & Plan Note (Addendum)
S: symptoms worsening. Continued confusion. More difficult to talk to. has been confrontational with staff members daily over last few weeks seems to have worsened. Very uncooperative. Very confused most of the time. Will "fire" staff and then a few minutes will forget whole thing. ROS-  Level 5 caveat applies but per CNA with patient-no complaints of pain, appetite normal, no shortness of breath.  A/P: No cause for worsening confusion on exam. Get basic labs- possible UTI on UA- culture and micro sent. Start risperdal- discussed increased mortality risk with CNA but hopeful wife can come with patient to future visit so we can discuss. Also b12 through neuro notes. Has hearing aids- does not wear

## 2015-10-28 NOTE — Telephone Encounter (Signed)
Faxed message from Dr. Yong Channel to [facility]

## 2015-10-28 NOTE — Telephone Encounter (Signed)
Patient actually was able to urinate for [place] yesterday- no sample needed

## 2015-10-30 LAB — URINE CULTURE

## 2015-11-01 ENCOUNTER — Other Ambulatory Visit: Payer: Self-pay

## 2015-11-01 DIAGNOSIS — N390 Urinary tract infection, site not specified: Secondary | ICD-10-CM

## 2015-11-01 MED ORDER — CEPHALEXIN 500 MG PO CAPS: 500.0000 mg | ORAL_CAPSULE | Freq: Three times a day (TID) | ORAL | 0 refills | Status: AC

## 2015-11-16 ENCOUNTER — Encounter: Payer: Self-pay | Admitting: Internal Medicine

## 2015-11-16 ENCOUNTER — Non-Acute Institutional Stay: Payer: Medicare Other | Admitting: Internal Medicine

## 2015-11-16 VITALS — BP 162/72 | HR 55 | Temp 97.3°F | Ht 69.0 in | Wt 171.0 lb

## 2015-11-16 DIAGNOSIS — I872 Venous insufficiency (chronic) (peripheral): Secondary | ICD-10-CM

## 2015-11-16 DIAGNOSIS — I10 Essential (primary) hypertension: Secondary | ICD-10-CM | POA: Diagnosis not present

## 2015-11-16 DIAGNOSIS — F0391 Unspecified dementia with behavioral disturbance: Secondary | ICD-10-CM | POA: Diagnosis not present

## 2015-11-16 DIAGNOSIS — G319 Degenerative disease of nervous system, unspecified: Secondary | ICD-10-CM | POA: Diagnosis not present

## 2015-11-16 DIAGNOSIS — E785 Hyperlipidemia, unspecified: Secondary | ICD-10-CM

## 2015-11-16 DIAGNOSIS — F03918 Unspecified dementia, unspecified severity, with other behavioral disturbance: Secondary | ICD-10-CM

## 2015-11-16 DIAGNOSIS — Z86010 Personal history of colonic polyps: Secondary | ICD-10-CM

## 2015-11-16 DIAGNOSIS — Z860101 Personal history of adenomatous and serrated colon polyps: Secondary | ICD-10-CM

## 2015-11-16 DIAGNOSIS — I679 Cerebrovascular disease, unspecified: Secondary | ICD-10-CM

## 2015-11-16 DIAGNOSIS — K5730 Diverticulosis of large intestine without perforation or abscess without bleeding: Secondary | ICD-10-CM

## 2015-11-16 HISTORY — DX: Cerebrovascular disease, unspecified: I67.9

## 2015-11-16 HISTORY — DX: Personal history of adenomatous and serrated colon polyps: Z86.0101

## 2015-11-16 HISTORY — DX: Diverticulosis of large intestine without perforation or abscess without bleeding: K57.30

## 2015-11-16 HISTORY — DX: Degenerative disease of nervous system, unspecified: G31.9

## 2015-11-16 HISTORY — DX: Personal history of colonic polyps: Z86.010

## 2015-11-16 NOTE — Progress Notes (Signed)
HISTORY AND PHYSICAL  Location:    [facility] Room Number: NT61 Place of Service: Clinic (12)   Extended Emergency Contact Information Primary Emergency Contact: Funk,Susan Address: [place] [address] [place]te Litter of [place] Phone: [phone] Mobile Phone: [phone] Relation: Daughter Secondary Emergency Contact: Chisenhall,John Address: [address] [place] [address] [place] of [place] Phone: [phone] Relation: Son  Advanced Directive information Does patient have an advance directive?: Yes, Type of Advance Directive: Healthcare Power of [place];Out of facility DNR (pink MOST or yellow form)  Chief Complaint  Patient presents with  . Medical Management of Chronic Issues    New Patient, switching to Dr. Nyoka Cowden. Lives in [place] . Here with Care Hector Pratt    HPI:  This is my first visit with Mr. Hector Pratt. We have previously been following his wife and the assisted-living area. Patient was removed from independent living to the assisted living area 12/30/2014 due to increasing problems of memory. Memory issues have been associated with behavioral challenges recently with combativeness and difficult situations to redirect the patient. He says he is aware of his memory slippage. He is currently on Risperdal. He has been seen by neurologist, Dr. Tomi Likens. Dr. Tomi Likens thinks that this is most likely a vascular dementia. 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 Although previous interpretation by his PCP, Dr. Yong Channel mentioned the possibility of a stroke, review of the x-rays and the radiology interpretation does not support a previous CVA.  Other issues include a chronic peripheral edema at about 1+ bipedal that has been attributed to chronic venous insufficiency.  Past Medical History:  Diagnosis Date  . ACUT SUPPRATV OTITIS MEDIA W/O SPONT RUP  EARDRUM 09/01/2008   patient denies rupture  . ANKLE INJURY 02/28/2010  . Bradycardia   . Cerebral atrophy 11/16/2015  . Cerebrovascular disease 11/16/2015  . DEPRESSION 01/02/2007   patient denies history  . Diverticulosis of colon without hemorrhage 11/16/2015  . DJD (degenerative joint disease)   . GLAUCOMA NOS 01/03/2007  . Hx of adenomatous colonic polyps 11/16/2015  . HYPERLIPIDEMIA 01/02/2007  . HYPERTENSION 10/30/2007  . Memory loss   . PROSTATE CANCER, HX OF 01/02/2007  . Slow transit constipation 11/04/2010   The pt has noted worsening constipation over the last year Small pellet stools No pain Three days between BMs   . VENOUS INSUFFICIENCY, CHRONIC 01/03/2007    Past Surgical History:  Procedure Laterality Date  . COLONOSCOPY  05/12/2005   polyps, diverticulosis, hemorrhoids; Dr. Fuller Plan  . lymph node removal     associated with prostate cancer    Patient Care Team: Estill Dooms, MD as PCP - General (Internal Medicine) Glennie Isle, PA-C as Consulting Physician (Dermatology) Shon Hough, MD as Consulting Physician (Ophthalmology) Man Otho Darner, NP as Nurse Practitioner (Internal Medicine) Pieter Partridge, DO as Consulting Physician (Neurology) Ladene Artist, MD as Consulting Physician (Gastroenterology)  Social History   Social History  . Marital status: Married    Spouse name: N/A  . Number of children: N/A  . Years of education: N/A   Occupational History  . Retired    Social History Main Topics  . Smoking status: Former Smoker    Packs/day: 1.00    Years: 19.00    Types: Cigarettes    Quit date: 10/04/1957  .  Smokeless tobacco: Never Used  . Alcohol use No  . Drug use: No  . Sexual activity: Yes   Other Topics Concern  . Not on file   Social History Narrative   Retired in 1989 from [employer] to [employer] in 89 from [place] (10 years)   Lives with wife. Lives in retirement home-independent living [facility].    Does  all ADLS and IADLs in 0962.    Married 25 years, lost 1st wife due to cancer. 2 children, wife has 1 children. Son worked for [employer] and lived 3 years ago. 1 grandkid on wife's side, 2 on her side. No great grandkids.       Hobbies: used to play golf until recently, watch tv   POA, DNR, MOST    reports that he quit smoking about 58 years ago. His smoking use included Cigarettes. He has a 19.00 pack-year smoking history. He has never used smokeless tobacco. He reports that he does not drink alcohol or use drugs.  Family History  Problem Relation Age of Onset  . Prostate cancer Father   . Congestive Heart Failure Brother   . Heart attack Brother   . Stroke Brother   . Alcohol abuse Other   . Cancer Other 50    relative  . Stroke Other 50    relative  . Prostate cancer Other     1st degree relative <50   Family Status  Relation Status  . Mother Deceased at age 78  . Father Deceased at age 66   prostate cancer  . Brother Deceased at age 24   congestive heart failure  . Brother Deceased at age 22   heart attack  . Brother Alive   stroke  . Brother Deceased at age 3  . Daughter Alive  . Son Alive  . Other     Immunization History  Administered Date(s) Administered  . Influenza Split 01/10/2011, 01/05/2012  . Influenza Whole 04/10/1997, 01/27/2009, 02/03/2010  . Influenza, High Dose Seasonal PF 01/09/2013  . Influenza,inj,Quad PF,36+ Mos 12/28/2014  . Influenza-Unspecified 01/31/2014, 03/09/2015  . PPD Test 01/10/2011  . Pneumococcal Polysaccharide-23 04/10/1996, 01/10/2011  . Td 04/10/1996, 01/03/2007  . Tdap 11/29/2010    No Known Allergies  Medications: Patient's Medications  New Prescriptions   No medications on file  Previous Medications   CARTEOLOL (OCUPRESS) 1 % OPHTHALMIC SOLUTION    Place 1 drop into both eyes 2 (two) times daily.    LISINOPRIL (PRINIVIL,ZESTRIL) 5 MG TABLET    Take 1 tablet (5 mg total) by mouth daily.   POLYVINYL ALCOHOL (LIQUIFILM  TEARS) 1.4 % OPHTHALMIC SOLUTION    Place 1 drop into both eyes 4 (four) times daily as needed for dry eyes.   RISPERIDONE (RISPERDAL) 0.25 MG TABLET    Take 1 tablet (0.25 mg total) by mouth at bedtime.   TRAVATAN Z 0.004 % SOLN OPHTHALMIC SOLUTION    Place 1 drop into both eyes at bedtime.   Modified Medications   No medications on file  Discontinued Medications   No medications on file    Review of Systems  Constitutional: Positive for fatigue. Negative for activity change, appetite change, fever and unexpected weight change.  HENT: Positive for hearing loss. Negative for congestion, ear pain, rhinorrhea, sore throat, tinnitus, trouble swallowing and voice change.   Eyes:       Corrective lenses  Respiratory: Negative for cough, choking, chest tightness, shortness of breath and  wheezing.   Cardiovascular: Positive for leg swelling. Negative for chest pain and palpitations.  Gastrointestinal: Positive for constipation. Negative for abdominal distention, abdominal pain, diarrhea and nausea.  Endocrine: Negative for cold intolerance, heat intolerance, polydipsia, polyphagia and polyuria.  Genitourinary: Negative for dysuria, frequency, testicular pain and urgency.       Not incontinent. Hx of prostate cancer.  Musculoskeletal: Positive for gait problem (using walker). Negative for arthralgias, back pain, myalgias and neck pain.  Skin: Negative for color change, pallor and rash.  Allergic/Immunologic: Negative.   Neurological: Negative for dizziness, tremors, syncope, speech difficulty, weakness, numbness and headaches.       Dementia. ind wanders during conversation.  Hematological: Negative for adenopathy. Does not bruise/bleed easily.  Psychiatric/Behavioral: Positive for behavioral problems, confusion, decreased concentration and dysphoric mood. Negative for hallucinations and sleep disturbance. The patient is not nervous/anxious.     Vitals:   11/16/15 1009  BP: (!) 162/72  Pulse:  (!) 55  Temp: 97.3 F (36.3 C)  TempSrc: Oral  SpO2: 96%  Weight: 171 lb (77.6 kg)  Height: _0  (1.753 m)   Body mass index is 25.25 kg/m. Filed Weights   11/16/15 1009  Weight: 171 lb (77.6 kg)     Physical Exam  Constitutional: He appears well-developed and well-nourished. No distress.  HENT:  Right Ear: External ear normal.  Left Ear: External ear normal.  Nose: Nose normal.  Mouth/Throat: Oropharynx is clear and moist. No oropharyngeal exudate.  Bilateral hearing loss  Eyes: Conjunctivae and EOM are normal. Pupils are equal, round, and reactive to light.  Neck: No JVD present. No tracheal deviation present. No thyromegaly present.  Cardiovascular: Normal rate, regular rhythm, normal heart sounds and intact distal pulses.  Exam reveals no gallop and no friction rub.   No murmur heard. Pulmonary/Chest: No respiratory distress. He has no wheezes. He has no rales. He exhibits no tenderness.  Abdominal: He exhibits no distension and no mass. There is no tenderness.  Musculoskeletal: Normal range of motion. He exhibits edema (1+ bipedal). He exhibits no tenderness.  unstable gait  Lymphadenopathy:    He has no cervical adenopathy.  Neurological: He is alert. He has normal reflexes. No cranial nerve deficit. Coordination abnormal.  Dementia 04/29/15 MMSE 21/30. Failed clockdrawing.  Skin: No rash noted. No erythema. No pallor.  Psychiatric: His speech is normal. His affect is labile and inappropriate. He is agitated, aggressive and combative. Cognition and memory are impaired. He expresses impulsivity and inappropriate judgment. He exhibits abnormal recent memory and abnormal remote memory.  Episodes of agitation and anger. Improved since on risperidone. He is inattentive.    Labs reviewed: Lab Summary Latest Ref Rng & Units 10/27/2015 07/22/2015 06/21/2015 05/13/2015  Hemoglobin 13.0 - 17.0 g/dL 13.8 12.7(L) (None) 12.1(A)  Hematocrit 39.0 - 52.0 % 41.2 37.4(L) (None) 36(A)    White count 4.0 - 10.5 K/uL 6.1 11.6(H) (None) 5.1  Platelet count 150.0 - 400.0 K/uL 271.0 268 (None) 258  Sodium 135 - 145 mmol/L (None) 140 139 141  Potassium 3.5 - 5.1 mmol/L (None) 3.8 4.3 4.1  Calcium 8.9 - 10.3 mg/dL (None) 8.7(L) 9.1 (None)  Phosphorus - (None) (None) (None) (None)  Creatinine 0.61 - 1.24 mg/dL (None) 0.90 1.03 0.8  AST 15 - 41 U/L (None) 20 (None) 16  Alk Phos 38 - 126 U/L (None) 51 (None) 83  Bilirubin 0.3 - 1.2 mg/dL (None) 1.3(H) (None) (None)  Glucose 65 - 99 mg/dL (None) 130(H) 114(H) 82  Cholesterol - (  None) (None) (None) (None)  HDL cholesterol - (None) (None) (None) (None)  Triglycerides - (None) (None) (None) (None)  LDL Direct - (None) (None) (None) (None)  LDL Calc - (None) (None) (None) (None)  Total protein 6.5 - 8.1 g/dL (None) 7.0 (None) (None)  Albumin 3.5 - 5.0 g/dL (None) 3.6 (None) (None)  Some recent data might be hidden   Lab Results  Component Value Date   BUN 23 (H) 07/22/2015   No results found for: HGBA1C Lab Results  Component Value Date   TSH 1.31 11/23/2014    Mr Brain Wo Contrast  Result Date: 07/23/2015 CLINICAL DATA:  Confusion, difficulty hearing, and memory loss for 1 month. EXAM: MRI HEAD WITHOUT CONTRAST TECHNIQUE: Multiplanar, multiecho pulse sequences of the brain and surrounding structures were obtained without intravenous contrast. COMPARISON:  07/11/2015. FINDINGS: Extreme motion degradation. Best efforts of patient and technologist were only partially successful in mitigating image degradation. No evidence for acute stroke, acute hemorrhage, hydrocephalus, or extra-axial fluid. Advanced atrophy. Extensive T2 and FLAIR hyperintensities throughout periventricular and subcortical white matter representing chronic microvascular ischemic change. Flow voids are maintained in the carotid, basilar and vertebral arteries. Remote RIGHT cerebellar infarct. Unremarkable pituitary and cerebellar tonsils. No upper cervical  lesions. In the LEFT posterior frontal cortex, there is a 10 x 8 x 7 mm focus of subacute and chronic hemorrhage, well-demarcated from surrounding brain, no vasogenic edema, with moderate T1 and intense T2 shortening, consistent with an incidental cavernoma. Tiny focus of chronic hemorrhage RIGHT thalamus. Negative orbits, and mastoids. Chronic sinus disease in the smaller RIGHT division sphenoid without expansion. IMPRESSION: Motion degraded examination demonstrating no acute intracranial findings. Advanced atrophy with extensive small vessel disease. 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 Chronic RIGHT sphenoid sinusitis. Electronically Signed   By: Staci Righter M.D.   On: 07/23/2015 13:55     Assessment/Plan  1. Dementia with behavioral disturbance Continue risperidone for now. Opinion of the neurologist is that he is not likely to benefit from drugs such as donepezil or memantine.  2. Cerebral atrophy Finding on MRI  3. Cerebrovascular disease Finding on MRI  4. Essential hypertension Elevated SBP today. Continue to monitor. No change in medication.  5. Hyperlipidemia Controlled  6. Venous (peripheral) insufficiency Associated with chronic pedal edema. He is not wearing his compression stockings today. Advised to send her to look for them and apply them. If she cannot find any knee-high compression stockings, then we will order them. -Furosemide 20 mg daily  7. Hx of adenomatous colonic polyps Last colonoscopy 2007 by Dr. Lucio Edward  8. Diverticulosis of colon without hemorrhage Found on Last colonoscopy

## 2015-11-29 LAB — BASIC METABOLIC PANEL
BUN: 25 mg/dL — AB (ref 4–21)
Creatinine: 1 mg/dL (ref <=1.3)
Glucose: 88 mg/dL
Potassium: 4.1 mmol/L (ref 3.4–5.3)
SODIUM: 140 mmol/L (ref 137–147)

## 2015-11-30 ENCOUNTER — Other Ambulatory Visit: Payer: Self-pay | Admitting: *Deleted

## 2015-12-14 ENCOUNTER — Non-Acute Institutional Stay: Payer: Medicare Other | Admitting: Internal Medicine

## 2015-12-14 ENCOUNTER — Encounter: Payer: Self-pay | Admitting: Internal Medicine

## 2015-12-14 VITALS — BP 148/62 | HR 56 | Temp 97.6°F | Ht 69.0 in | Wt 169.0 lb

## 2015-12-14 DIAGNOSIS — M24541 Contracture, right hand: Secondary | ICD-10-CM | POA: Diagnosis not present

## 2015-12-14 DIAGNOSIS — M720 Palmar fascial fibromatosis [Dupuytren]: Secondary | ICD-10-CM | POA: Diagnosis not present

## 2015-12-14 DIAGNOSIS — I872 Venous insufficiency (chronic) (peripheral): Secondary | ICD-10-CM | POA: Diagnosis not present

## 2015-12-14 DIAGNOSIS — F0391 Unspecified dementia with behavioral disturbance: Secondary | ICD-10-CM | POA: Diagnosis not present

## 2015-12-14 DIAGNOSIS — M24549 Contracture, unspecified hand: Secondary | ICD-10-CM | POA: Insufficient documentation

## 2015-12-14 DIAGNOSIS — I10 Essential (primary) hypertension: Secondary | ICD-10-CM | POA: Diagnosis not present

## 2015-12-14 DIAGNOSIS — R269 Unspecified abnormalities of gait and mobility: Secondary | ICD-10-CM

## 2015-12-14 DIAGNOSIS — F03918 Unspecified dementia, unspecified severity, with other behavioral disturbance: Secondary | ICD-10-CM

## 2015-12-14 HISTORY — DX: Unspecified abnormalities of gait and mobility: R26.9

## 2015-12-14 MED ORDER — LISINOPRIL-HYDROCHLOROTHIAZIDE 10-12.5 MG PO TABS: ORAL_TABLET | ORAL | 11 refills | Status: DC

## 2015-12-14 NOTE — Progress Notes (Signed)
[place] Room Number: IH03  Place of Service: Clinic (12)     No Known Allergies  Chief Complaint  Patient presents with  . Medical Management of Chronic Issues    4 week follow-up dementia, blood pressure, venous insufficiency. Here with [place].     HPI:  Patient returns to clinic today to follow-up of issues identified 4 weeks ago.  Essential hypertension - follow systolic blood pressure remains a little bit high, the overall pressures are improved as compared to those obtained 4 weeks ago. Blood pressures are improve overall. The  systolic BP continues to be a little elevated with a range of 148 through 170. Diastolic pressures are all normal.  Dementia with behavioral disturbance - significant memory disturbance.  Venous (peripheral) insufficiency - chronic. Leads to persistent lower leg and pedal edema of about 2+.  Abnormality of gait - using walker    Medications: Patient's Medications  New Prescriptions   No medications on file  Previous Medications   CARTEOLOL (OCUPRESS) 1 % OPHTHALMIC SOLUTION    Place 1 drop into both eyes 2 (two) times daily.    FUROSEMIDE (LASIX) 20 MG TABLET    Take 20 mg by mouth. Take one tablet daily for edema   LISINOPRIL (PRINIVIL,ZESTRIL) 5 MG TABLET    Take 1 tablet (5 mg total) by mouth daily.   POLYVINYL ALCOHOL (LIQUIFILM TEARS) 1.4 % OPHTHALMIC SOLUTION    Place 1 drop into both eyes 4 (four) times daily as needed for dry eyes.   RISPERIDONE (RISPERDAL) 0.25 MG TABLET    Take 1 tablet (0.25 mg total) by mouth at bedtime.   TRAVATAN Z 0.004 % SOLN OPHTHALMIC SOLUTION    Place 1 drop into both eyes at bedtime.   Modified Medications   No medications on file  Discontinued Medications   No medications on file     Review of Systems  Constitutional: Positive for fatigue. Negative for activity change, appetite change, fever and unexpected weight change.  HENT: Positive for hearing loss. Negative  for congestion, ear pain, rhinorrhea, sore throat, tinnitus, trouble swallowing and voice change.   Eyes:       Corrective lenses  Respiratory: Negative for cough, choking, chest tightness, shortness of breath and wheezing.   Cardiovascular: Positive for leg swelling. Negative for chest pain and palpitations.  Gastrointestinal: Positive for constipation. Negative for abdominal distention, abdominal pain, diarrhea and nausea.  Endocrine: Negative for cold intolerance, heat intolerance, polydipsia, polyphagia and polyuria.  Genitourinary: Negative for dysuria, frequency, testicular pain and urgency.       Not incontinent. Hx of prostate cancer.  Musculoskeletal: Positive for gait problem (using walker). Negative for arthralgias, back pain, myalgias and neck pain.       Chronic contracture of the right fifth finger PIP joint. Dupuytren's contracture of the fifth finger bilaterally.  Skin: Negative for color change, pallor and rash.  Allergic/Immunologic: Negative.   Neurological: Negative for dizziness, tremors, syncope, speech difficulty, weakness, numbness and headaches.       Dementia. Mind wanders during conversation.  Hematological: Negative for adenopathy. Does not bruise/bleed easily.  Psychiatric/Behavioral: Positive for behavioral problems, confusion, decreased concentration and dysphoric mood. Negative for hallucinations and sleep disturbance. The patient is not nervous/anxious.     Vitals:   12/14/15 1019  BP: (!) 148/62  Pulse: (!) 56  Temp: 97.6 F (36.4 C)  TempSrc: Oral  SpO2: 97%  Weight: 169 lb (76.7 kg)  Height:  '5\' 9"'  (1.753 m)   Wt Readings from Last 3 Encounters:  12/14/15 169 lb (76.7 kg)  11/16/15 171 lb (77.6 kg)  10/27/15 164 lb 12.8 oz (74.8 kg)    Body mass index is 24.96 kg/m.  Physical Exam  Constitutional: He appears well-developed and well-nourished. No distress.  HENT:  Right Ear: External ear normal.  Left Ear: External ear normal.  Nose: Nose  normal.  Mouth/Throat: Oropharynx is clear and moist. No oropharyngeal exudate.  Bilateral hearing loss  Eyes: Conjunctivae and EOM are normal. Pupils are equal, round, and reactive to light.  Neck: No JVD present. No tracheal deviation present. No thyromegaly present.  Cardiovascular: Normal rate, regular rhythm, normal heart sounds and intact distal pulses.  Exam reveals no gallop and no friction rub.   No murmur heard. Pulmonary/Chest: No respiratory distress. He has no wheezes. He has no rales. He exhibits no tenderness.  Abdominal: He exhibits no distension and no mass. There is no tenderness.  Musculoskeletal: Normal range of motion. He exhibits edema (1+ bipedal). He exhibits no tenderness.  unstable gait  Lymphadenopathy:    He has no cervical adenopathy.  Neurological: He is alert. He has normal reflexes. No cranial nerve deficit. Coordination abnormal.  Dementia 04/29/15 MMSE 21/30. Failed clockdrawing.  Skin: No rash noted. No erythema. No pallor.  Psychiatric: His speech is normal. His affect is labile and inappropriate. He is agitated, aggressive and combative. Cognition and memory are impaired. He expresses impulsivity and inappropriate judgment. He exhibits abnormal recent memory and abnormal remote memory.  Episodes of agitation and anger. Improved since on risperidone. He is inattentive.     Labs reviewed: Lab Summary Latest Ref Rng & Units 11/29/2015 10/27/2015 07/22/2015 06/21/2015 05/13/2015  Hemoglobin 13.0 - 17.0 g/dL (None) 13.8 12.7(L) (None) 12.1(A)  Hematocrit 39.0 - 52.0 % (None) 41.2 37.4(L) (None) 36(A)  White count 4.0 - 10.5 K/uL (None) 6.1 11.6(H) (None) 5.1  Platelet count 150.0 - 400.0 K/uL (None) 271.0 268 (None) 258  Sodium 137 - 147 mmol/L 140 (None) 140 139 141  Potassium 3.4 - 5.3 mmol/L 4.1 (None) 3.8 4.3 4.1  Calcium 8.9 - 10.3 mg/dL (None) (None) 8.7(L) 9.1 (None)  Phosphorus - (None) (None) (None) (None) (None)  Creatinine 0.6 - 1.3 mg/dL 1.0 (None)  0.90 1.03 0.8  AST 15 - 41 U/L (None) (None) 20 (None) 16  Alk Phos 38 - 126 U/L (None) (None) 51 (None) 83  Bilirubin 0.3 - 1.2 mg/dL (None) (None) 1.3(H) (None) (None)  Glucose mg/dL 88 (None) 130(H) 114(H) 82  Cholesterol - (None) (None) (None) (None) (None)  HDL cholesterol - (None) (None) (None) (None) (None)  Triglycerides - (None) (None) (None) (None) (None)  LDL Direct - (None) (None) (None) (None) (None)  LDL Calc - (None) (None) (None) (None) (None)  Total protein 6.5 - 8.1 g/dL (None) (None) 7.0 (None) (None)  Albumin 3.5 - 5.0 g/dL (None) (None) 3.6 (None) (None)  Some recent data might be hidden   Lab Results  Component Value Date   TSH 1.31 11/23/2014   Lab Results  Component Value Date   BUN 25 (A) 11/29/2015   BUN 23 (H) 07/22/2015   BUN 25 (H) 06/21/2015   Lab Results  Component Value Date   CREATININE 1.0 11/29/2015   CREATININE 0.90 07/22/2015   CREATININE 1.03 06/21/2015   No results found for: HGBA1C     Assessment/Plan  1. Essential hypertension - change to lisinopril/HCT 10/12.5 daily - discontinue furosemide  2.  Dementia with behavioral disturbance -  discontinue Risperdal-  3. Venous (peripheral) insufficiency  chronic and unchanged  4. Abnormality of gait  continue to use walker  5. Contracture of finger joint, right chronic  6. Dupuytren's contracture of both hands  chronic. Given patient's overall conditions, I would not recommend surgery.

## 2015-12-30 DIAGNOSIS — I10 Essential (primary) hypertension: Secondary | ICD-10-CM | POA: Diagnosis not present

## 2015-12-30 LAB — BASIC METABOLIC PANEL
BUN: 23 mg/dL — AB (ref 4–21)
CREATININE: 0.9 mg/dL (ref <=1.3)
Glucose: 90 mg/dL
POTASSIUM: 4 mmol/L (ref 3.4–5.3)
Sodium: 140 mmol/L (ref 137–147)

## 2015-12-31 DIAGNOSIS — L84 Corns and callosities: Secondary | ICD-10-CM | POA: Diagnosis not present

## 2015-12-31 DIAGNOSIS — M79671 Pain in right foot: Secondary | ICD-10-CM | POA: Diagnosis not present

## 2015-12-31 DIAGNOSIS — L602 Onychogryphosis: Secondary | ICD-10-CM | POA: Diagnosis not present

## 2015-12-31 DIAGNOSIS — M79672 Pain in left foot: Secondary | ICD-10-CM | POA: Diagnosis not present

## 2016-01-04 ENCOUNTER — Other Ambulatory Visit: Payer: Self-pay | Admitting: *Deleted

## 2016-02-01 DIAGNOSIS — L570 Actinic keratosis: Secondary | ICD-10-CM | POA: Diagnosis not present

## 2016-02-01 DIAGNOSIS — D692 Other nonthrombocytopenic purpura: Secondary | ICD-10-CM | POA: Diagnosis not present

## 2016-02-01 DIAGNOSIS — L821 Other seborrheic keratosis: Secondary | ICD-10-CM | POA: Diagnosis not present

## 2016-02-22 ENCOUNTER — Non-Acute Institutional Stay: Payer: Medicare Other | Admitting: Nurse Practitioner

## 2016-02-22 ENCOUNTER — Encounter: Payer: Self-pay | Admitting: Nurse Practitioner

## 2016-02-22 DIAGNOSIS — I872 Venous insufficiency (chronic) (peripheral): Secondary | ICD-10-CM | POA: Diagnosis not present

## 2016-02-22 DIAGNOSIS — W19XXXA Unspecified fall, initial encounter: Secondary | ICD-10-CM | POA: Diagnosis not present

## 2016-02-22 DIAGNOSIS — R269 Unspecified abnormalities of gait and mobility: Secondary | ICD-10-CM | POA: Diagnosis not present

## 2016-02-22 DIAGNOSIS — F01518 Vascular dementia, unspecified severity, with other behavioral disturbance: Secondary | ICD-10-CM

## 2016-02-22 DIAGNOSIS — I10 Essential (primary) hypertension: Secondary | ICD-10-CM

## 2016-02-22 DIAGNOSIS — F0151 Vascular dementia with behavioral disturbance: Secondary | ICD-10-CM

## 2016-02-22 NOTE — Assessment & Plan Note (Signed)
Wobbly gait, walker, supervision

## 2016-02-22 NOTE — Assessment & Plan Note (Addendum)
unstable gait, fell 02/18/16, no apparent injury. Lack of safety awareness related to dementia contributory. Intensive supervision for safety.

## 2016-02-22 NOTE — Assessment & Plan Note (Signed)
Controlled, continue lisinopril/HCT 10/12.5mg . 12/30/15 Na 140, K 4.0, Bun 23, creat 0.92

## 2016-02-22 NOTE — Progress Notes (Signed)
Location:  [facility] Room Number: 52 Place of Service:  ALF [phone]) Provider:  Mast,Manxie  NP  Jeanmarie Hubert, MD  Patient Care Team: Estill Dooms, MD as PCP - General (Internal Medicine) Glennie Isle, PA-C as Consulting Physician (Dermatology) Shon Hough, MD as Consulting Physician (Ophthalmology) Man Otho Darner, NP as Nurse Practitioner (Internal Medicine) Pieter Partridge, DO as Consulting Physician (Neurology) Ladene Artist, MD as Consulting Physician (Gastroenterology)  Extended Emergency Contact Information Primary Emergency Contact: Funk,Susan Address: [place] [address] John[place]te Litter of [place] Phone: [phone] Mobile Phone: [phone] Relation: Daughter Secondary Emergency Contact: Sheldon Silvan Address: [address] [place] [address] [place] Phone: [phone] Relation: Son  Code Status:  DNR Goals of care: Advanced Directive information Advanced Directives 02/22/2016  Does patient have an advance directive? Yes  Type of Paramedic of [place];Out of facility DNR (pink MOST or yellow form)  Does patient want to make changes to advanced directive? No - Patient declined  Copy of advanced directive(s) in chart? Yes  Would patient like information on creating an advanced directive? -  Pre-existing out of facility DNR order (yellow form or pink MOST form) -     Chief Complaint  Patient presents with  . Acute Visit    Found on floor, no shoes on, unable to verbalize how he fell    HPI:  Pt is a 82 y.o. male seen today for an acute visit for unstable gait, fell 02/18/16, no apparent injury.     Hx of dementia, resides in [place] with wife, he has a Char[employer] fundraiser, not taking memory preserving meds. Blood pressure is controlled while on Lisinopril/HCT 10/12.5mg .  Past Medical History:  Diagnosis Date  . Abnormality of gait 12/14/2015  .  ACUT SUPPRATV OTITIS MEDIA W/O SPONT RUP EARDRUM 09/01/2008   patient denies rupture  . ANKLE INJURY 02/28/2010  . Bradycardia   . Cerebral atrophy 11/16/2015  . Cerebrovascular disease 11/16/2015  . DEPRESSION 01/02/2007   patient denies history  . Diverticulosis of colon without hemorrhage 11/16/2015  . DJD (degenerative joint disease)   . GLAUCOMA NOS 01/03/2007  . Hx of adenomatous colonic polyps 11/16/2015  . HYPERLIPIDEMIA 01/02/2007  . HYPERTENSION 10/30/2007  . Memory loss   . PROSTATE CANCER, HX OF 01/02/2007  . Slow transit constipation 11/04/2010   The pt has noted worsening constipation over the last year Small pellet stools No pain Three days between BMs   . VENOUS INSUFFICIENCY, CHRONIC 01/03/2007   Past Surgical History:  Procedure Laterality Date  . COLONOSCOPY  05/12/2005   polyps, diverticulosis, hemorrhoids; Dr. Fuller Plan  . lymph node removal     associated with prostate cancer    No Known Allergies    Medication List       Accurate as of 02/22/16  2:28 PM. Always use your most recent med list.          carteolol 1 % ophthalmic solution Commonly known as:  OCUPRESS Place 1 drop into both eyes 2 (two) times daily.   lisinopril-hydrochlorothiazide 10-12.5 MG tablet Commonly known as:  PRINZIDE,ZESTORETIC One daily to control blood pressure and control edema   polyvinyl alcohol 1.4 % ophthalmic solution Commonly known as:  LIQUIFILM TEARS Place 1 drop into both eyes 4 (four) times daily as needed for dry eyes.  TRAVATAN Z 0.004 % Soln ophthalmic solution Generic drug:  Travoprost (BAK Free) Place 1 drop into both eyes at bedtime.       Review of Systems  Constitutional: Positive for fatigue. Negative for activity change, appetite change, fever and unexpected weight change.  HENT: Positive for hearing loss. Negative for congestion, ear pain, rhinorrhea, sore throat, tinnitus, trouble swallowing and voice change.   Eyes:       Corrective lenses  Respiratory:  Negative for cough, choking, chest tightness, shortness of breath and wheezing.   Cardiovascular: Positive for leg swelling. Negative for chest pain and palpitations.  Gastrointestinal: Positive for constipation. Negative for abdominal distention, abdominal pain, diarrhea and nausea.  Endocrine: Negative for cold intolerance, heat intolerance, polydipsia, polyphagia and polyuria.  Genitourinary: Negative for dysuria, frequency, testicular pain and urgency.       Not incontinent. Hx of prostate cancer.  Musculoskeletal: Positive for gait problem (using walker). Negative for arthralgias, back pain, myalgias and neck pain.       Chronic contracture of the right fifth finger PIP joint. Dupuytren's contracture of the fifth finger bilaterally.  Skin: Negative for color change, pallor and rash.  Allergic/Immunologic: Negative.   Neurological: Negative for dizziness, tremors, syncope, speech difficulty, weakness, numbness and headaches.       Dementia. Mind wanders during conversation.  Hematological: Negative for adenopathy. Does not bruise/bleed easily.  Psychiatric/Behavioral: Positive for behavioral problems, confusion, decreased concentration and dysphoric mood. Negative for hallucinations and sleep disturbance. The patient is not nervous/anxious.     Immunization History  Administered Date(s) Administered  . Influenza Split 01/10/2011, 01/05/2012  . Influenza Whole 04/10/1997, 01/27/2009, 02/03/2010  . Influenza, High Dose Seasonal PF 01/09/2013  . Influenza,inj,Quad PF,36+ Mos 12/28/2014  . Influenza-Unspecified 01/31/2014, 03/09/2015  . PPD Test 01/10/2011  . Pneumococcal Polysaccharide-23 04/10/1996, 01/10/2011  . Td 04/10/1996, 01/03/2007  . Tdap 11/29/2010   Pertinent  Health Maintenance Due  Topic Date Due  . PNA vac Low Risk Adult (2 of 2 - PCV13) 01/10/2012  . INFLUENZA VACCINE  11/09/2015   Fall Risk  11/16/2015 11/16/2015 10/13/2015 08/28/2014  Falls in the past year? Yes No No No    Number falls in past yr: 1 - - -  Injury with Fall? Yes - - -   Functional Status Survey:    Vitals:   02/22/16 1005  BP: (!) 167/79  Pulse: 67  Resp: 18  Temp: (!) 96.8 F (36 C)  Weight: 151 lb (68.5 kg)  Height: 5\' 9"  (1.753 m)   Body mass index is 22.3 kg/m. Physical Exam  Constitutional: He appears well-developed and well-nourished. No distress.  HENT:  Right Ear: External ear normal.  Left Ear: External ear normal.  Nose: Nose normal.  Mouth/Throat: Oropharynx is clear and moist. No oropharyngeal exudate.  Bilateral hearing loss  Eyes: Conjunctivae and EOM are normal. Pupils are equal, round, and reactive to light.  Neck: No JVD present. No tracheal deviation present. No thyromegaly present.  Cardiovascular: Normal rate, regular rhythm, normal heart sounds and intact distal pulses.  Exam reveals no gallop and no friction rub.   No murmur heard. Pulmonary/Chest: No respiratory distress. He has no wheezes. He has no rales. He exhibits no tenderness.  Abdominal: He exhibits no distension and no mass. There is no tenderness.  Musculoskeletal: Normal range of motion. He exhibits edema (1+ bipedal). He exhibits no tenderness.  unstable gait, fell 02/18/16, no apparent injury.   Lymphadenopathy:    He has no cervical  adenopathy.  Neurological: He is alert. He has normal reflexes. No cranial nerve deficit. Coordination abnormal.  Dementia 04/29/15 MMSE 21/30. Failed clockdrawing.  Skin: No rash noted. No erythema. No pallor.  Psychiatric: His speech is normal. His affect is labile and inappropriate. He is agitated, aggressive and combative. Cognition and memory are impaired. He expresses impulsivity and inappropriate judgment. He exhibits abnormal recent memory and abnormal remote memory.  Episodes of agitation and anger. Improved since on risperidone. He is inattentive.    Labs reviewed:  Recent Labs  06/21/15 1328 07/22/15 1446 11/29/15 12/30/15  NA 139 140 140 140   K 4.3 3.8 4.1 4.0  CL 103 106  --   --   CO2 31 25  --   --   GLUCOSE 114* 130*  --   --   BUN 25* 23* 25* 23*  CREATININE 1.03 0.90 1.0 0.9  CALCIUM 9.1 8.7*  --   --     Recent Labs  05/13/15 07/22/15 1446  AST 16 20  ALT 10 15*  ALKPHOS 83 51  BILITOT  --  1.3*  PROT  --  7.0  ALBUMIN  --  3.6    Recent Labs  05/13/15 07/22/15 1446 10/27/15 1138  WBC 5.1 11.6* 6.1  NEUTROABS  --  8.9* 3.6  HGB 12.1* 12.7* 13.8  HCT 36* 37.4* 41.2  MCV  --  97.1 97.4  PLT 258 268 271.0   Lab Results  Component Value Date   TSH 1.31 11/23/2014   No results found for: HGBA1C Lab Results  Component Value Date   CHOL 198 04/13/2014   HDL 52.80 04/13/2014   LDLCALC 128 (H) 04/13/2014   LDLDIRECT 113.5 10/09/2011   TRIG 85.0 04/13/2014   CHOLHDL 4 04/13/2014    Significant Diagnostic Results in last 30 days:  No results found.  Assessment/Plan Fall unstable gait, fell 02/18/16, no apparent injury. Lack of safety awareness related to dementia contributory. Intensive supervision for safety.   Essential hypertension Controlled, continue lisinopril/HCT 10/12.5mg . 12/30/15 Na 140, K 4.0, Bun 23, creat 0.92   Venous (peripheral) insufficiency Chronic, trace edema BLE  Dementia with behavioral disturbance 12/01/14 MMSE 24/30. Has seen neurology- prn follow up only. No aricept. 12/30/15 Na 140, K 4.0, Bun 23, creat 0.92    Abnormality of gait Wobbly gait, walker, supervision     Family/ staff Communication: continue AL  Labs/tests ordered:  none

## 2016-02-22 NOTE — Assessment & Plan Note (Signed)
12/01/14 MMSE 24/30. Has seen neurology- prn follow up only. No aricept. 12/30/15 Na 140, K 4.0, Bun 23, creat 0.92

## 2016-02-22 NOTE — Assessment & Plan Note (Signed)
Chronic, trace edema BLE.  

## 2016-03-07 DIAGNOSIS — S40811A Abrasion of right upper arm, initial encounter: Secondary | ICD-10-CM | POA: Diagnosis not present

## 2016-03-07 DIAGNOSIS — L570 Actinic keratosis: Secondary | ICD-10-CM | POA: Diagnosis not present

## 2016-03-14 ENCOUNTER — Encounter: Payer: Self-pay | Admitting: Nurse Practitioner

## 2016-03-14 ENCOUNTER — Non-Acute Institutional Stay: Payer: Medicare Other | Admitting: Nurse Practitioner

## 2016-03-14 DIAGNOSIS — L0291 Cutaneous abscess, unspecified: Secondary | ICD-10-CM | POA: Insufficient documentation

## 2016-03-14 DIAGNOSIS — L0231 Cutaneous abscess of buttock: Secondary | ICD-10-CM | POA: Diagnosis not present

## 2016-03-14 DIAGNOSIS — I10 Essential (primary) hypertension: Secondary | ICD-10-CM

## 2016-03-14 DIAGNOSIS — F01518 Vascular dementia, unspecified severity, with other behavioral disturbance: Secondary | ICD-10-CM

## 2016-03-14 DIAGNOSIS — K5901 Slow transit constipation: Secondary | ICD-10-CM | POA: Diagnosis not present

## 2016-03-14 DIAGNOSIS — F0151 Vascular dementia with behavioral disturbance: Secondary | ICD-10-CM

## 2016-03-14 NOTE — Assessment & Plan Note (Signed)
12/01/14 MMSE 24/30. Has seen neurology- prn follow up only. No aricept. 12/30/15 Na 140, K 4.0, Bun 23, creat 0.92

## 2016-03-14 NOTE — Assessment & Plan Note (Signed)
Excoriated buttocks, a large skin abscess lower left buttock, I+D, packing, warm compress, 3 weeks Doxy

## 2016-03-14 NOTE — Assessment & Plan Note (Signed)
Controlled, continue lisinopril/HCT 10/12.5mg . 12/30/15 Na 140, K 4.0, Bun 23, creat 0.92

## 2016-03-14 NOTE — Assessment & Plan Note (Signed)
BM 2-3 days.

## 2016-03-14 NOTE — Progress Notes (Signed)
Location:  [facility] Room Number: 45 Place of Service:  ALF [phone]) Provider:  Kai Railsback, Manxie  NP  Jeanmarie Hubert, MD  Patient Care Team: Estill Dooms, MD as PCP - General (Internal Medicine) Glennie Isle, PA-C as Consulting Physician (Dermatology) Shon Hough, MD as Consulting Physician (Ophthalmology) Jawanza Zambito Otho Darner, NP as Nurse Practitioner (Internal Medicine) Pieter Partridge, DO as Consulting Physician (Neurology) Ladene Artist, MD as Consulting Physician (Gastroenterology)  Extended Emergency Contact Information Primary Emergency Contact: Funk,Susan Address: [place] [address] Johnnette Litter of [place] Phone: [phone] Mobile Phone: [phone] Relation: Daughter Secondary Emergency Contact: Sheldon Silvan Address: [address] [place] [address] [place] Phone: [phone] Relation: Son  Code Status:  DNR Goals of care: Advanced Directive information Advanced Directives 03/14/2016  Does Patient Have a Medical Advance Directive? Yes  Type of Paramedic of [place];Out of facility DNR (pink MOST or yellow form)  Does patient want to make changes to medical advance directive? No - Patient declined  Copy of [place] in Chart? Yes  Would patient like information on creating a medical advance directive? -  Pre-existing out of facility DNR order (yellow form or pink MOST form) -     Chief Complaint  Patient presents with  . Acute Visit    boil on right buttock    HPI:  Pt is a 82 y.o. male seen today for an acute visit for Excoriated buttocks, a large skin abscess lower left buttock, I+D, packing, warm compress, 3 weeks Doxy    Hx of dementia, resides in [place] with wife, he has a Charity fundraiser, not taking memory preserving meds. Blood pressure is controlled while on Lisinopril/HCT 10/12.5mg .   Past Medical History:    Diagnosis Date  . Abnormality of gait 12/14/2015  . ACUT SUPPRATV OTITIS MEDIA W/O SPONT RUP EARDRUM 09/01/2008   patient denies rupture  . ANKLE INJURY 02/28/2010  . Bradycardia   . Cerebral atrophy 11/16/2015  . Cerebrovascular disease 11/16/2015  . DEPRESSION 01/02/2007   patient denies history  . Diverticulosis of colon without hemorrhage 11/16/2015  . DJD (degenerative joint disease)   . GLAUCOMA NOS 01/03/2007  . Hx of adenomatous colonic polyps 11/16/2015  . HYPERLIPIDEMIA 01/02/2007  . HYPERTENSION 10/30/2007  . Memory loss   . PROSTATE CANCER, HX OF 01/02/2007  . Slow transit constipation 11/04/2010   The pt has noted worsening constipation over the last year Small pellet stools No pain Three days between BMs   . VENOUS INSUFFICIENCY, CHRONIC 01/03/2007   Past Surgical History:  Procedure Laterality Date  . COLONOSCOPY  05/12/2005   polyps, diverticulosis, hemorrhoids; Dr. Fuller Plan  . lymph node removal     associated with prostate cancer    No Known Allergies    Medication List       Accurate as of 03/14/16 11:59 PM. Always use your most recent med list.          carteolol 1 % ophthalmic solution Commonly known as:  OCUPRESS Place 1 drop into both eyes 2 (two) times daily.   lisinopril-hydrochlorothiazide 10-12.5 MG tablet Commonly known as:  PRINZIDE,ZESTORETIC One daily to control blood pressure and control edema   polyvinyl alcohol 1.4 % ophthalmic solution Commonly known as:  LIQUIFILM TEARS Place 1 drop into both eyes 4 (four) times  daily as needed for dry eyes.   TRAVATAN Z 0.004 % Soln ophthalmic solution Generic drug:  Travoprost (BAK Free) Place 1 drop into both eyes at bedtime.       Review of Systems  Constitutional: Positive for fatigue. Negative for activity change, appetite change, fever and unexpected weight change.  HENT: Positive for hearing loss. Negative for congestion, ear pain, rhinorrhea, sore throat, tinnitus, trouble swallowing and voice  change.   Eyes:       Corrective lenses  Respiratory: Negative for cough, choking, chest tightness, shortness of breath and wheezing.   Cardiovascular: Positive for leg swelling. Negative for chest pain and palpitations.  Gastrointestinal: Positive for constipation. Negative for abdominal distention, abdominal pain, diarrhea and nausea.  Endocrine: Negative for cold intolerance, heat intolerance, polydipsia, polyphagia and polyuria.  Genitourinary: Negative for dysuria, frequency, testicular pain and urgency.       Not incontinent. Hx of prostate cancer.  Musculoskeletal: Positive for gait problem (using walker). Negative for arthralgias, back pain, myalgias and neck pain.       Chronic contracture of the right fifth finger PIP joint. Dupuytren's contracture of the fifth finger bilaterally.  Skin: Negative for color change, pallor and rash.       Excoriated buttocks, a large skin abscess lower left buttock, I+D, packing, warm compress, 3 weeks Doxy  Allergic/Immunologic: Negative.   Neurological: Negative for dizziness, tremors, syncope, speech difficulty, weakness, numbness and headaches.       Dementia. Mind wanders during conversation.  Hematological: Negative for adenopathy. Does not bruise/bleed easily.  Psychiatric/Behavioral: Positive for behavioral problems, confusion, decreased concentration and dysphoric mood. Negative for hallucinations and sleep disturbance. The patient is not nervous/anxious.     Immunization History  Administered Date(s) Administered  . Influenza Split 01/10/2011, 01/05/2012  . Influenza Whole 04/10/1997, 01/27/2009, 02/03/2010  . Influenza, High Dose Seasonal PF 01/09/2013  . Influenza,inj,Quad PF,36+ Mos 12/28/2014  . Influenza-Unspecified 01/31/2014, 03/09/2015  . PPD Test 01/10/2011  . Pneumococcal Polysaccharide-23 04/10/1996, 01/10/2011  . Td 04/10/1996, 01/03/2007  . Tdap 11/29/2010   Pertinent  Health Maintenance Due  Topic Date Due  . PNA vac  Low Risk Adult (2 of 2 - PCV13) 01/10/2012  . INFLUENZA VACCINE  11/09/2015   Fall Risk  11/16/2015 11/16/2015 10/13/2015 08/28/2014  Falls in the past year? Yes No No No  Number falls in past yr: 1 - - -  Injury with Fall? Yes - - -   Functional Status Survey:    Vitals:   03/14/16 1413  BP: (!) 154/82  Pulse: 62  Resp: 18  Temp: (!) 96.7 F (35.9 C)  Weight: 160 lb 3.2 oz (72.7 kg)  Height: 5\' 9"  (1.753 m)   Body mass index is 23.66 kg/m. Physical Exam  Labs reviewed:  Recent Labs  06/21/15 1328 07/22/15 1446 11/29/15 12/30/15  NA 139 140 140 140  K 4.3 3.8 4.1 4.0  CL 103 106  --   --   CO2 31 25  --   --   GLUCOSE 114* 130*  --   --   BUN 25* 23* 25* 23*  CREATININE 1.03 0.90 1.0 0.9  CALCIUM 9.1 8.7*  --   --     Recent Labs  05/13/15 07/22/15 1446  AST 16 20  ALT 10 15*  ALKPHOS 83 51  BILITOT  --  1.3*  PROT  --  7.0  ALBUMIN  --  3.6    Recent Labs  05/13/15 07/22/15 1446 10/27/15  1138  WBC 5.1 11.6* 6.1  NEUTROABS  --  8.9* 3.6  HGB 12.1* 12.7* 13.8  HCT 36* 37.4* 41.2  MCV  --  97.1 97.4  PLT 258 268 271.0   Lab Results  Component Value Date   TSH 1.31 11/23/2014   No results found for: HGBA1C Lab Results  Component Value Date   CHOL 198 04/13/2014   HDL 52.80 04/13/2014   LDLCALC 128 (H) 04/13/2014   LDLDIRECT 113.5 10/09/2011   TRIG 85.0 04/13/2014   CHOLHDL 4 04/13/2014    Significant Diagnostic Results in last 30 days:  No results found.  Assessment/Plan Abscess of skin and subcutaneous tissue Excoriated buttocks, a large skin abscess lower left buttock, I+D, packing, warm compress, 3 weeks Doxy     Essential hypertension Controlled, continue lisinopril/HCT 10/12.5mg . 12/30/15 Na 140, K 4.0, Bun 23, creat 0.92    Slow transit constipation BM 2-3 days.   Dementia with behavioral disturbance 12/01/14 MMSE 24/30. Has seen neurology- prn follow up only. No aricept. 12/30/15 Na 140, K 4.0, Bun 23, creat  0.92        Family/ staff Communication: AL  Labs/tests ordered:  none

## 2016-03-22 ENCOUNTER — Emergency Department (HOSPITAL_COMMUNITY)
Admission: EM | Admit: 2016-03-22 | Discharge: 2016-03-22 | Disposition: A | Discharge status: Skilled Nursing Facility | Payer: Medicare Other | Attending: Emergency Medicine | Admitting: Emergency Medicine

## 2016-03-22 ENCOUNTER — Emergency Department (HOSPITAL_COMMUNITY): Payer: Medicare Other

## 2016-03-22 ENCOUNTER — Encounter (HOSPITAL_COMMUNITY): Payer: Self-pay | Admitting: Emergency Medicine

## 2016-03-22 DIAGNOSIS — T148XXA Other injury of unspecified body region, initial encounter: Secondary | ICD-10-CM | POA: Diagnosis not present

## 2016-03-22 DIAGNOSIS — S61209A Unspecified open wound of unspecified finger without damage to nail, initial encounter: Secondary | ICD-10-CM | POA: Diagnosis not present

## 2016-03-22 DIAGNOSIS — M5489 Other dorsalgia: Secondary | ICD-10-CM | POA: Diagnosis not present

## 2016-03-22 DIAGNOSIS — Z87891 Personal history of nicotine dependence: Secondary | ICD-10-CM | POA: Insufficient documentation

## 2016-03-22 DIAGNOSIS — Y999 Unspecified external cause status: Secondary | ICD-10-CM | POA: Insufficient documentation

## 2016-03-22 DIAGNOSIS — Y939 Activity, unspecified: Secondary | ICD-10-CM | POA: Insufficient documentation

## 2016-03-22 DIAGNOSIS — S6991XA Unspecified injury of right wrist, hand and finger(s), initial encounter: Secondary | ICD-10-CM | POA: Diagnosis present

## 2016-03-22 DIAGNOSIS — S61511A Laceration without foreign body of right wrist, initial encounter: Secondary | ICD-10-CM | POA: Diagnosis not present

## 2016-03-22 DIAGNOSIS — M79609 Pain in unspecified limb: Secondary | ICD-10-CM | POA: Diagnosis not present

## 2016-03-22 DIAGNOSIS — S61212A Laceration without foreign body of right middle finger without damage to nail, initial encounter: Secondary | ICD-10-CM | POA: Diagnosis not present

## 2016-03-22 DIAGNOSIS — Z8546 Personal history of malignant neoplasm of prostate: Secondary | ICD-10-CM | POA: Insufficient documentation

## 2016-03-22 DIAGNOSIS — W07XXXA Fall from chair, initial encounter: Secondary | ICD-10-CM | POA: Insufficient documentation

## 2016-03-22 DIAGNOSIS — Y929 Unspecified place or not applicable: Secondary | ICD-10-CM | POA: Insufficient documentation

## 2016-03-22 DIAGNOSIS — M79643 Pain in unspecified hand: Secondary | ICD-10-CM | POA: Diagnosis not present

## 2016-03-22 DIAGNOSIS — I10 Essential (primary) hypertension: Secondary | ICD-10-CM | POA: Diagnosis not present

## 2016-03-22 DIAGNOSIS — S61411A Laceration without foreign body of right hand, initial encounter: Secondary | ICD-10-CM | POA: Diagnosis not present

## 2016-03-22 DIAGNOSIS — S0990XA Unspecified injury of head, initial encounter: Secondary | ICD-10-CM | POA: Insufficient documentation

## 2016-03-22 DIAGNOSIS — S199XXA Unspecified injury of neck, initial encounter: Secondary | ICD-10-CM | POA: Diagnosis not present

## 2016-03-22 DIAGNOSIS — S41109A Unspecified open wound of unspecified upper arm, initial encounter: Secondary | ICD-10-CM | POA: Diagnosis not present

## 2016-03-22 DIAGNOSIS — Z79899 Other long term (current) drug therapy: Secondary | ICD-10-CM | POA: Diagnosis not present

## 2016-03-22 DIAGNOSIS — S62609A Fracture of unspecified phalanx of unspecified finger, initial encounter for closed fracture: Secondary | ICD-10-CM | POA: Diagnosis not present

## 2016-03-22 DIAGNOSIS — W19XXXA Unspecified fall, initial encounter: Secondary | ICD-10-CM

## 2016-03-22 MED ORDER — ACETAMINOPHEN 325 MG PO TABS
650.0000 mg | ORAL_TABLET | Freq: Once | ORAL | Status: AC
Start: 2016-03-22 — End: 2016-03-22
  Administered 2016-03-22: 650 mg via ORAL
  Filled 2016-03-22: qty 2

## 2016-03-22 NOTE — ED Triage Notes (Signed)
Per EMS, patient was getting out of chair after breakfast and fell backwards. Staff wrapped patient's right middle finger and states deformity was noted. Patient hit head. No blood thinner use. Alert and oriented at baseline per facility. Patient is from [facility]. DNR/MOST form at bedside. Hx of dementia.

## 2016-03-22 NOTE — ED Notes (Signed)
Bed: WA03 Expected date:  Expected time:  Means of arrival:  Comments: 

## 2016-03-22 NOTE — ED Notes (Signed)
Bed: WHALA Expected date:  Expected time:  Means of arrival:  Comments: 

## 2016-03-22 NOTE — ED Notes (Addendum)
Pt tore off previous dressing over skin tear on R forearm. Wound redressed by this RN.

## 2016-03-22 NOTE — ED Notes (Signed)
Pt pants changed and skin tear to R forearm dressed by Uvaldo Bristle NT with this RN assisting. Pt not cooperative and confused at this time.

## 2016-03-22 NOTE — ED Notes (Signed)
Pt has ripped finger splint off of his finger and is very aggressive with the staff members. Pt is trying to expose himself in the hallway as well. Lead tech at bedside at this time to assist with pt until PTAR arrives.

## 2016-03-22 NOTE — Discharge Instructions (Signed)
Wash the affected area with soap and water and apply a thin layer of topical antibiotic ointment. Do this every 12 hours.  ° °Do not use rubbing alcohol or hydrogen peroxide.                       ° °Look for signs of infection: if you see redness, if the area becomes warm, if pain increases sharply, there is discharge (pus), if red streaks appear or you develop fever or vomiting, RETURN immediately to the Emergency Department  for a recheck.  ° °Please follow with your primary care doctor in the next 2 days for a check-up. They must obtain records for further management.  ° °Do not hesitate to return to the Emergency Department for any new, worsening or concerning symptoms.  ° °

## 2016-03-22 NOTE — ED Provider Notes (Signed)
Hector Pratt   CSN: [id number] Arrival date & time: 03/22/16  1028     History   Chief Complaint Chief Complaint  Patient presents with  . Fall    HPI   Blood pressure 139/70, pulse 60, temperature 98 F (36.7 C), temperature source Oral, resp. rate 18, height 5\' 9"  (1.753 m), weight 72.6 kg, SpO2 95 %.  Hector Pratt is a 82 y.o. male with past medical history significant for dementia sent from SNF for evaluation of fall. Patient was getting out of a chair after breakfast and fell backwards there was head trauma, he is not anticoagulated. Patient has laceration to right middle finger and skin tear to right ulnar wrist. Patient is mentating at his baseline as per facility. Level V caveat secondary to altered mental status, patient nonverbal at his baseline.  Past Medical History:  Diagnosis Date  . Abnormality of gait 12/14/2015  . ACUT SUPPRATV OTITIS MEDIA W/O SPONT RUP EARDRUM 09/01/2008   patient denies rupture  . ANKLE INJURY 02/28/2010  . Bradycardia   . Cerebral atrophy 11/16/2015  . Cerebrovascular disease 11/16/2015  . DEPRESSION 01/02/2007   patient denies history  . Diverticulosis of colon without hemorrhage 11/16/2015  . DJD (degenerative joint disease)   . GLAUCOMA NOS 01/03/2007  . Hx of adenomatous colonic polyps 11/16/2015  . HYPERLIPIDEMIA 01/02/2007  . HYPERTENSION 10/30/2007  . Memory loss   . PROSTATE CANCER, HX OF 01/02/2007  . Slow transit constipation 11/04/2010   The pt has noted worsening constipation over the last year Small pellet stools No pain Three days between BMs   . VENOUS INSUFFICIENCY, CHRONIC 01/03/2007    Patient Active Problem List   Diagnosis Date Noted  . Abscess of skin and subcutaneous tissue 03/14/2016  . Fall 02/22/2016  . Abnormality of gait 12/14/2015  . Contracture of finger joint 12/14/2015  . Dupuytren's contracture of both hands 12/14/2015  . Cerebral atrophy 11/16/2015  . Cerebrovascular disease  11/16/2015  . Hx of adenomatous colonic polyps 11/16/2015  . Diverticulosis of colon without hemorrhage 11/16/2015  . DNR (do not resuscitate) 12/28/2014  . Dementia with behavioral disturbance 09/14/2014  . Neoplasm of uncertain behavior of skin 08/28/2014  . Bradycardia 11/11/2013  . Actinic keratosis 11/11/2013  . Slow transit constipation 11/04/2010  . Essential hypertension 10/30/2007  . Glaucoma 01/03/2007  . Venous (peripheral) insufficiency 01/03/2007  . Hyperlipidemia 01/02/2007  . PROSTATE CANCER, HX OF 01/02/2007    Past Surgical History:  Procedure Laterality Date  . COLONOSCOPY  05/12/2005   polyps, diverticulosis, hemorrhoids; Dr. Fuller Plan  . lymph node removal     associated with prostate cancer       Home Medications    Prior to Admission medications   Medication Sig Start Date End Date Taking? Authorizing Provider  carteolol (OCUPRESS) 1 % ophthalmic solution Place 1 drop into both eyes 2 (two) times daily.    Yes Historical Provider, MD  doxycycline (MONODOX) 100 MG capsule Take 100 mg by mouth 2 (two) times daily.   Yes Historical Provider, MD  lisinopril-hydrochlorothiazide (PRINZIDE,ZESTORETIC) 10-12.5 MG tablet One daily to control blood pressure and control edema Patient taking differently: Take 1 tablet by mouth daily. One daily to control blood pressure and control edema 12/14/15  Yes Estill Dooms, MD  polyvinyl alcohol (LIQUIFILM TEARS) 1.4 % ophthalmic solution Place 1 drop into both eyes 4 (four) times daily as needed for dry eyes.   Yes Historical Provider, MD  saccharomyces boulardii (  FLORASTOR) 250 MG capsule Take 250 mg by mouth 2 (two) times daily.   Yes Historical Provider, MD  TRAVATAN Z 0.004 % SOLN ophthalmic solution Place 1 drop into both eyes at bedtime.  11/02/14  Yes Historical Provider, MD    Family History Family History  Problem Relation Age of Onset  . Prostate cancer Father   . Congestive Heart Failure Brother   . Heart attack  Brother   . Stroke Brother   . Alcohol abuse Other   . Cancer Other 50    relative  . Stroke Other 50    relative  . Prostate cancer Other     1st degree relative <50    Social History Social History  Substance Use Topics  . Smoking status: Former Smoker    Packs/day: 1.00    Years: 19.00    Types: Cigarettes    Quit date: 10/04/1957  . Smokeless tobacco: Never Used  . Alcohol use No     Allergies   Patient has no known allergies.   Review of Systems Review of Systems  10 systems reviewed and found to be negative, except as noted in the HPI.   Physical Exam Updated Vital Signs BP (!) 138/105 (BP Location: Right Arm)   Pulse 66   Temp 98 F (36.7 C) (Oral)   Resp 18   Ht 5\' 9"  (1.753 m)   Wt 72.6 kg   SpO2 98%   BMI 23.63 kg/m   Physical Exam  Constitutional: He appears well-developed and well-nourished. No distress.  HENT:  Head: Normocephalic and atraumatic.  Mouth/Throat: Oropharynx is clear and moist.  Eyes: Conjunctivae and EOM are normal. Pupils are equal, round, and reactive to light.  Neck: Normal range of motion.  Cardiovascular: Normal rate, regular rhythm and intact distal pulses.   Pulmonary/Chest: Effort normal and breath sounds normal. No respiratory distress. He has no wheezes. He has no rales. He exhibits no tenderness.  Abdominal: Soft. He exhibits no distension and no mass. There is no tenderness. There is no rebound and no guarding. No hernia.  Musculoskeletal: Normal range of motion. He exhibits tenderness.  Skin tear to right middle digit PIP, erythema to joints, full range of motion, no bone is visible.  Patient also has skin tear of right ulnar wrist with no focal bony tenderness of the snuffbox.  Neurological: He is alert.  Alert, intermittently verbal  Skin: Capillary refill takes less than 2 seconds. He is not diaphoretic.  Psychiatric: He has a normal mood and affect.  Nursing Pratt and vitals reviewed.    ED Treatments /  Results  Labs (all labs ordered are listed, but only abnormal results are displayed) Labs Reviewed - No data to display  EKG  EKG Interpretation None       Radiology Ct Head Wo Contrast  Result Date: 03/22/2016 CLINICAL DATA:  82 year old male status post fall. Uncooperative during imaging. Initial encounter. EXAM: CT HEAD WITHOUT CONTRAST CT CERVICAL SPINE WITHOUT CONTRAST TECHNIQUE: Multidetector CT imaging of the head and cervical spine was performed following the standard protocol without intravenous contrast. Multiplanar CT image reconstructions of the cervical spine were also generated. COMPARISON:  CT head and cervical spine 07/11/2015. Brain MRI 07/23/2015. FINDINGS: CT HEAD FINDINGS Brain: Stable cerebral volume. Dural calcifications including along the interhemispheric fissure. Patchy and confluent cerebral white matter hypodensity and bilateral deep gray matter heterogeneity. Stable gray-white matter differentiation throughout the brain. No midline shift, ventriculomegaly, mass effect, evidence of mass lesion, intracranial hemorrhage  or evidence of cortically based acute infarction. Vascular: Calcified atherosclerosis at the skull base. Skull: No acute osseous abnormality identified. Sinuses/Orbits: Opacification of the right sphenoid sinus is unchanged. Other Visualized paranasal sinuses and mastoids are stable and well pneumatized. Other: No scalp hematoma identified. No acute orbit or scalp soft tissue finding. CT CERVICAL SPINE FINDINGS Alignment: Stable cervical vertebral height and alignment. Cervicothoracic junction alignment is within normal limits. Bilateral posterior element alignment is within normal limits. Mild anterolisthesis at C3-C4, C4-C5, and C5-C6. Associated moderate to severe facet arthropathy, greater on the right. Skull base and vertebrae: Visualized skull base is intact. No atlanto-occipital dissociation. 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 Mild motion artifact through the C3 level. No acute cervical spine fracture identified. Soft tissues and spinal canal: No prevertebral fluid or swelling. No visible canal hematoma. Negative for age noncontrast CT appearance of the neck. Disc levels: Chronic mid and lower cervical spine disc space loss and endplate degeneration appears stable. Moderate to severe right greater than left facet arthropathy. Evidence of chronic interbody and posterior element ankylosis at C6-C7. Upper chest: Visualized upper thoracic levels appear intact. Stable mild apical lung scarring. IMPRESSION: 1. No acute intracranial abnormality. Stable non contrast CT appearance of the brain. 2. Stable CT appearance of the cervical spine, no acute fracture or listhesis identified. 3.  No acute traumatic injury identified. 4. Advanced cervical facet arthropathy in the setting of multilevel mild upper cervical spondylolisthesis. Evidence of chronic C6-C7 ankylosis, with adjacent segment advanced disc and endplate degeneration. Electronically Signed   By: Genevie Ann M.D.   On: 03/22/2016 12:47   Ct Cervical Spine Wo Contrast  Result Date: 03/22/2016 CLINICAL DATA:  82 year old male status post fall. Uncooperative during imaging. Initial encounter. EXAM: CT HEAD WITHOUT CONTRAST CT CERVICAL SPINE WITHOUT CONTRAST TECHNIQUE: Multidetector CT imaging of the head and cervical spine was performed following the standard protocol without intravenous contrast. Multiplanar CT image reconstructions of the cervical spine were also generated. COMPARISON:  CT head and cervical spine 07/11/2015. Brain MRI 07/23/2015. FINDINGS: CT HEAD FINDINGS Brain: Stable cerebral volume. Dural calcifications including along the interhemispheric fissure. Patchy and confluent cerebral white matter hypodensity and bilateral deep gray matter heterogeneity. Stable gray-white matter differentiation throughout the brain. No midline shift, ventriculomegaly, mass effect,  evidence of mass lesion, intracranial hemorrhage or evidence of cortically based acute infarction. Vascular: Calcified atherosclerosis at the skull base. Skull: No acute osseous abnormality identified. Sinuses/Orbits: Opacification of the right sphenoid sinus is unchanged. Other Visualized paranasal sinuses and mastoids are stable and well pneumatized. Other: No scalp hematoma identified. No acute orbit or scalp soft tissue finding. CT CERVICAL SPINE FINDINGS Alignment: Stable cervical vertebral height and alignment. Cervicothoracic junction alignment is within normal limits. Bilateral posterior element alignment is within normal limits. Mild anterolisthesis at C3-C4, C4-C5, and C5-C6. Associated moderate to severe facet arthropathy, greater on the right. Skull base and vertebrae: Visualized skull base is intact. No atlanto-occipital dissociation. 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 Mild motion artifact through the C3 level. No acute cervical spine fracture identified. Soft tissues and spinal canal: No prevertebral fluid or swelling. No visible canal hematoma. Negative for age noncontrast CT appearance of the neck. Disc levels: Chronic mid and lower cervical spine disc space loss and endplate degeneration appears stable. Moderate to severe right greater than left facet arthropathy. Evidence of chronic interbody and posterior element ankylosis at C6-C7. Upper chest: Visualized upper thoracic levels appear intact. Stable  mild apical lung scarring. IMPRESSION: 1. No acute intracranial abnormality. Stable non contrast CT appearance of the brain. 2. Stable CT appearance of the cervical spine, no acute fracture or listhesis identified. 3.  No acute traumatic injury identified. 4. Advanced cervical facet arthropathy in the setting of multilevel mild upper cervical spondylolisthesis. Evidence of chronic C6-C7 ankylosis, with adjacent segment advanced disc and endplate degeneration.  Electronically Signed   By: Genevie Ann M.D.   On: 03/22/2016 12:47   Dg Hand Complete Right  Result Date: 03/22/2016 CLINICAL DATA:  Fall, right hand laceration and pain, initial encounter. EXAM: RIGHT HAND - COMPLETE 3+ VIEW COMPARISON:  None. FINDINGS: Hand is contracted, limiting evaluation.  No definite fracture. IMPRESSION: Hand is contracted, limiting evaluation.  No definite fracture. Electronically Signed   By: Lorin Picket M.D.   On: 03/22/2016 12:29    Procedures Procedures (including critical care time)  Medications Ordered in ED Medications  acetaminophen (TYLENOL) tablet 650 mg (650 mg Oral Given 03/22/16 1140)     Initial Impression / Assessment and Plan / ED Course  I have reviewed the triage vital signs and the nursing notes.  Pertinent labs & imaging results that were available during my care of the patient were reviewed by me and considered in my medical decision making (see chart for details).  Clinical Course     Vitals:   03/22/16 1039 03/22/16 1041 03/22/16 1258  BP: 139/70  (!) 138/105  Pulse: 60  66  Resp: 18  18  Temp: 98 F (36.7 C)    TempSrc: Oral    SpO2: 95%  98%  Weight:  72.6 kg   Height:  5\' 9"  (1.753 m)     Medications  acetaminophen (TYLENOL) tablet 650 mg (650 mg Oral Given 03/22/16 1140)    Hector Pratt is 82 y.o. male presenting with Fall from SNF. Patient is mentating at his baseline as per EMS. CT head, C-spine and right hand are negative, port images based on patient compliance. Wound is cleaned and dressed. Discussed with daughter Hector Pratt who states that patient was at breakfast this morning she stood up and fell backwards, there was head trauma, he is not anticoagulated, it appears to be is mentating at his baseline, updated her on imaging results and plan, she is amenable.  Case discussed with SNF nurse: From friend's home who confirms this patient fell backwards, states that he is intermittently verbal with history of  dementia, I've explained to her that the x-rays and CT are negative, she is concerned about his right middle finger. Place and splint and have them follow-up with primary care.  This is a shared visit with the attending physician who personally evaluated the patient and agrees with the care plan.   Evaluation does not show pathology that would require ongoing emergent intervention or inpatient treatment. Pt is hemodynamically stable and mentating appropriately. Discussed findings and plan with patient/guardian, who agrees with care plan. All questions answered. Return precautions discussed and outpatient follow up given.   Final Clinical Impressions(s) / ED Diagnoses   Final diagnoses:  Fall, initial encounter  Multiple skin tears    New Prescriptions New Prescriptions   No medications on file     Monico Blitz, PA-C 03/22/16 [place], MD 03/26/16 1527

## 2016-03-22 NOTE — ED Notes (Signed)
Ortho notified of finger splint need.

## 2016-03-24 ENCOUNTER — Encounter: Payer: Self-pay | Admitting: Internal Medicine

## 2016-03-24 ENCOUNTER — Non-Acute Institutional Stay: Payer: Medicare Other | Admitting: Internal Medicine

## 2016-03-24 DIAGNOSIS — L0231 Cutaneous abscess of buttock: Secondary | ICD-10-CM | POA: Diagnosis not present

## 2016-03-24 DIAGNOSIS — W19XXXA Unspecified fall, initial encounter: Secondary | ICD-10-CM

## 2016-03-24 DIAGNOSIS — F01518 Vascular dementia, unspecified severity, with other behavioral disturbance: Secondary | ICD-10-CM

## 2016-03-24 DIAGNOSIS — R269 Unspecified abnormalities of gait and mobility: Secondary | ICD-10-CM

## 2016-03-24 DIAGNOSIS — F0151 Vascular dementia with behavioral disturbance: Secondary | ICD-10-CM | POA: Diagnosis not present

## 2016-03-24 DIAGNOSIS — T148XXA Other injury of unspecified body region, initial encounter: Secondary | ICD-10-CM | POA: Diagnosis not present

## 2016-03-24 DIAGNOSIS — I10 Essential (primary) hypertension: Secondary | ICD-10-CM | POA: Diagnosis not present

## 2016-03-24 HISTORY — DX: Other injury of unspecified body region, initial encounter: T14.8XXA

## 2016-03-24 NOTE — Progress Notes (Signed)
Progress Note    Location:  [facility] Room Number: [place] of Service:  [id number] [phone]) Provider:  Jeanmarie Hubert, MD  Patient Care Team: Estill Dooms, MD as PCP - General (Internal Medicine) Glennie Isle, PA-C as Consulting Physician (Dermatology) Shon Hough, MD as Consulting Physician (Ophthalmology) Man Otho Darner, NP as Nurse Practitioner (Internal Medicine) Pieter Partridge, DO as Consulting Physician (Neurology) Ladene Artist, MD as Consulting Physician (Gastroenterology)  Extended Emergency Contact Information Primary Emergency Contact: Funk,Susan Address: [place] [address] Johnnette Litter of [place] Phone: [phone] Mobile Phone: [phone] Relation: Daughter Secondary Emergency Contact: Somerville,John Address: [address] [place] [address] [place] of [place] Phone: [phone] Relation: Son  Code Status:  DNR Goals of care: Advanced Directive information Advanced Directives 03/24/2016  Does Patient Have a Medical Advance Directive? Yes  Type of Paramedic of [place];Out of facility DNR (pink MOST or yellow form)  Does patient want to make changes to medical advance directive? -  Copy of [place] in Chart? Yes  Would patient like information on creating a medical advance directive? -  Pre-existing out of facility DNR order (yellow form or pink MOST form) Yellow form placed in chart (order not valid for inpatient use);Pink MOST form placed in chart (order not valid for inpatient use)     Chief Complaint  Patient presents with  . Acute Visit    fell went to ED 03/22/16 -multiple skin tears    HPI:  Pt is a 82 y.o. male seen today for an acute visit following a trip to the ER. He fell at his AL and sustained multiple skin tears. No head trauma was apparent. He has been declining over the last few months and is no longer  appropriate for safe care in the AL environment. He has a progressive dementia that is most likely Alzheimer's disease. He has become aggressive with staff and is difficult to redirect. He refuses care frequently. He is incontinent and requires assistance with all ADL. He is a high fall risk.   Past Medical History:  Diagnosis Date  . Abnormality of gait 12/14/2015  . ACUT SUPPRATV OTITIS MEDIA W/O SPONT RUP EARDRUM 09/01/2008   patient denies rupture  . ANKLE INJURY 02/28/2010  . Bradycardia   . Cerebral atrophy 11/16/2015  . Cerebrovascular disease 11/16/2015  . DEPRESSION 01/02/2007   patient denies history  . Diverticulosis of colon without hemorrhage 11/16/2015  . DJD (degenerative joint disease)   . GLAUCOMA NOS 01/03/2007  . Hx of adenomatous colonic polyps 11/16/2015  . HYPERLIPIDEMIA 01/02/2007  . HYPERTENSION 10/30/2007  . Memory loss   . PROSTATE CANCER, HX OF 01/02/2007  . Slow transit constipation 11/04/2010   The pt has noted worsening constipation over the last year Small pellet stools No pain Three days between BMs   . VENOUS INSUFFICIENCY, CHRONIC 01/03/2007   Past Surgical History:  Procedure Laterality Date  . COLONOSCOPY  05/12/2005   polyps, diverticulosis, hemorrhoids; Dr. Fuller Plan  . lymph node removal     associated with prostate cancer    No Known Allergies  Allergies as of 03/24/2016   No Known Allergies     Medication List       Accurate as of 03/24/16 11:33 AM. Always use your most recent med list.  acetaminophen 650 MG CR tablet Commonly known as:  TYLENOL 650 mg. Special instructions: standing order may give x48 hours as needed.   carteolol 1 % ophthalmic solution Commonly known as:  OCUPRESS Place 1 drop into both eyes 2 (two) times daily.   doxycycline 100 MG capsule Commonly known as:  MONODOX Take 100 mg by mouth 2 (two) times daily.   lisinopril-hydrochlorothiazide 10-12.5 MG tablet Commonly known as:  PRINZIDE,ZESTORETIC Take 1  tablet by mouth. Take one tablet daily for blood pressure   polyvinyl alcohol 1.4 % ophthalmic solution Commonly known as:  LIQUIFILM TEARS Place 1 drop into both eyes 4 (four) times daily as needed for dry eyes.   saccharomyces boulardii 250 MG capsule Commonly known as:  FLORASTOR Take 250 mg by mouth 2 (two) times daily.   TRAVATAN Z 0.004 % Soln ophthalmic solution Generic drug:  Travoprost (BAK Free) Place 1 drop into both eyes at bedtime.   zinc oxide 20 % ointment Apply 1 application topically. Apply to reddened areas to buttocks to help prevent ski breakdown until resolved.       Review of Systems  Constitutional: Positive for fatigue. Negative for activity change, appetite change, fever and unexpected weight change.  HENT: Positive for hearing loss. Negative for congestion, ear pain, rhinorrhea, sore throat, tinnitus, trouble swallowing and voice change.   Eyes:       Corrective lenses  Respiratory: Negative for cough, choking, chest tightness, shortness of breath and wheezing.   Cardiovascular: Positive for leg swelling. Negative for chest pain and palpitations.  Gastrointestinal: Positive for constipation. Negative for abdominal distention, abdominal pain, diarrhea and nausea.  Endocrine: Negative for cold intolerance, heat intolerance, polydipsia, polyphagia and polyuria.  Genitourinary: Negative for dysuria, frequency, testicular pain and urgency.       Not incontinent. Hx of prostate cancer.  Musculoskeletal: Positive for gait problem (using walker). Negative for arthralgias, back pain, myalgias and neck pain.       Chronic contracture of the right fifth finger PIP joint. Dupuytren's contracture of the fifth finger bilaterally.  Skin: Positive for wound (multiple skin tears of the right arm and left hand). Negative for color change, pallor and rash.       Excoriated buttocks, a large skin abscess lower left buttock, I+D, packing, warm compress, 3 weeks Doxy    Allergic/Immunologic: Negative.   Neurological: Negative for dizziness, tremors, syncope, speech difficulty, weakness, numbness and headaches.       Severe Dementia with behavioral challenges.  Hematological: Negative for adenopathy. Does not bruise/bleed easily.  Psychiatric/Behavioral: Positive for behavioral problems, confusion, decreased concentration and dysphoric mood. Negative for hallucinations and sleep disturbance. The patient is not nervous/anxious.     Immunization History  Administered Date(s) Administered  . Influenza Split 01/10/2011, 01/05/2012  . Influenza Whole 04/10/1997, 01/27/2009, 02/03/2010  . Influenza, High Dose Seasonal PF 01/09/2013  . Influenza,inj,Quad PF,36+ Mos 12/28/2014  . Influenza-Unspecified 01/31/2014, 03/09/2015  . PPD Test 01/10/2011  . Pneumococcal Polysaccharide-23 04/10/1996, 01/10/2011  . Td 04/10/1996, 01/03/2007  . Tdap 11/29/2010   Pertinent  Health Maintenance Due  Topic Date Due  . PNA vac Low Risk Adult (2 of 2 - PCV13) 01/10/2012  . INFLUENZA VACCINE  11/09/2015   Fall Risk  11/16/2015 11/16/2015 10/13/2015 08/28/2014  Falls in the past year? Yes No No No  Number falls in past yr: 1 - - -  Injury with Fall? Yes - - -   Functional Status Survey:    Vitals:  03/24/16 1120  BP: (!) 158/80  Pulse: (!) 58  Resp: 18  Temp: (!) 96.8 F (36 C)  SpO2: 95%  Weight: 160 lb (72.6 kg)  Height: 6' (1.829 m)   Body mass index is 21.7 kg/m. Physical Exam  Constitutional: He appears well-developed and well-nourished. No distress.  HENT:  Right Ear: External ear normal.  Left Ear: External ear normal.  Nose: Nose normal.  Mouth/Throat: Oropharynx is clear and moist. No oropharyngeal exudate.  Bilateral hearing loss  Eyes: Conjunctivae and EOM are normal. Pupils are equal, round, and reactive to light.  Neck: No JVD present. No tracheal deviation present. No thyromegaly present.  Cardiovascular: Normal rate, regular rhythm, normal  heart sounds and intact distal pulses.  Exam reveals no gallop and no friction rub.   No murmur heard. Pulmonary/Chest: No respiratory distress. He has no wheezes. He has no rales. He exhibits no tenderness.  Abdominal: He exhibits no distension and no mass. There is no tenderness.  Musculoskeletal: Normal range of motion. He exhibits edema (1+ bipedal). He exhibits no tenderness.  unstable gait  Lymphadenopathy:    He has no cervical adenopathy.  Neurological: He is alert. He has normal reflexes. No cranial nerve deficit. Coordination abnormal.  Severely dementia 04/29/15 MMSE 21/30. Failed clockdrawing.  Skin: No rash noted. No erythema. No pallor.  Multiple skin tears. Buttock ulcer.  Psychiatric: His speech is normal. His affect is labile and inappropriate. He is agitated, aggressive and combative. Cognition and memory are impaired. He expresses impulsivity and inappropriate judgment. He exhibits abnormal recent memory and abnormal remote memory.  Episodes of agitation and anger. Improved since on risperidone. He is inattentive.    Labs reviewed:  Recent Labs  06/21/15 1328 07/22/15 1446 11/29/15 12/30/15  NA 139 140 140 140  K 4.3 3.8 4.1 4.0  CL 103 106  --   --   CO2 31 25  --   --   GLUCOSE 114* 130*  --   --   BUN 25* 23* 25* 23*  CREATININE 1.03 0.90 1.0 0.9  CALCIUM 9.1 8.7*  --   --     Recent Labs  05/13/15 07/22/15 1446  AST 16 20  ALT 10 15*  ALKPHOS 83 51  BILITOT  --  1.3*  PROT  --  7.0  ALBUMIN  --  3.6    Recent Labs  05/13/15 07/22/15 1446 10/27/15 1138  WBC 5.1 11.6* 6.1  NEUTROABS  --  8.9* 3.6  HGB 12.1* 12.7* 13.8  HCT 36* 37.4* 41.2  MCV  --  97.1 97.4  PLT 258 268 271.0   Lab Results  Component Value Date   TSH 1.31 11/23/2014   No results found for: HGBA1C Lab Results  Component Value Date   CHOL 198 04/13/2014   HDL 52.80 04/13/2014   LDLCALC 128 (H) 04/13/2014   LDLDIRECT 113.5 10/09/2011   TRIG 85.0 04/13/2014   CHOLHDL  4 04/13/2014    Significant Diagnostic Results in last 30 days:  Ct Head Wo Contrast  Result Date: 03/22/2016 CLINICAL DATA:  82 year old male status post fall. Uncooperative during imaging. Initial encounter. EXAM: CT HEAD WITHOUT CONTRAST CT CERVICAL SPINE WITHOUT CONTRAST TECHNIQUE: Multidetector CT imaging of the head and cervical spine was performed following the standard protocol without intravenous contrast. Multiplanar CT image reconstructions of the cervical spine were also generated. COMPARISON:  CT head and cervical spine 07/11/2015. Brain MRI 07/23/2015. FINDINGS: CT HEAD FINDINGS Brain: Stable cerebral volume. Dural calcifications  including along the interhemispheric fissure. Patchy and confluent cerebral white matter hypodensity and bilateral deep gray matter heterogeneity. Stable gray-white matter differentiation throughout the brain. No midline shift, ventriculomegaly, mass effect, evidence of mass lesion, intracranial hemorrhage or evidence of cortically based acute infarction. Vascular: Calcified atherosclerosis at the skull base. Skull: No acute osseous abnormality identified. Sinuses/Orbits: Opacification of the right sphenoid sinus is unchanged. Other Visualized paranasal sinuses and mastoids are stable and well pneumatized. Other: No scalp hematoma identified. No acute orbit or scalp soft tissue finding. CT CERVICAL SPINE FINDINGS Alignment: Stable cervical vertebral height and alignment. Cervicothoracic junction alignment is within normal limits. Bilateral posterior element alignment is within normal limits. Mild anterolisthesis at C3-C4, C4-C5, and C5-C6. Associated moderate to severe facet arthropathy, greater on the right. Skull base and vertebrae: Visualized skull base is intact. No atlanto-occipital dissociation. 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 Mild motion artifact through the C3 level. No acute cervical spine fracture identified. Soft tissues and  spinal canal: No prevertebral fluid or swelling. No visible canal hematoma. Negative for age noncontrast CT appearance of the neck. Disc levels: Chronic mid and lower cervical spine disc space loss and endplate degeneration appears stable. Moderate to severe right greater than left facet arthropathy. Evidence of chronic interbody and posterior element ankylosis at C6-C7. Upper chest: Visualized upper thoracic levels appear intact. Stable mild apical lung scarring. IMPRESSION: 1. No acute intracranial abnormality. Stable non contrast CT appearance of the brain. 2. Stable CT appearance of the cervical spine, no acute fracture or listhesis identified. 3.  No acute traumatic injury identified. 4. Advanced cervical facet arthropathy in the setting of multilevel mild upper cervical spondylolisthesis. Evidence of chronic C6-C7 ankylosis, with adjacent segment advanced disc and endplate degeneration. Electronically Signed   By: Genevie Ann M.D.   On: 03/22/2016 12:47   Ct Cervical Spine Wo Contrast  Result Date: 03/22/2016 CLINICAL DATA:  82 year old male status post fall. Uncooperative during imaging. Initial encounter. EXAM: CT HEAD WITHOUT CONTRAST CT CERVICAL SPINE WITHOUT CONTRAST TECHNIQUE: Multidetector CT imaging of the head and cervical spine was performed following the standard protocol without intravenous contrast. Multiplanar CT image reconstructions of the cervical spine were also generated. COMPARISON:  CT head and cervical spine 07/11/2015. Brain MRI 07/23/2015. FINDINGS: CT HEAD FINDINGS Brain: Stable cerebral volume. Dural calcifications including along the interhemispheric fissure. Patchy and confluent cerebral white matter hypodensity and bilateral deep gray matter heterogeneity. Stable gray-white matter differentiation throughout the brain. No midline shift, ventriculomegaly, mass effect, evidence of mass lesion, intracranial hemorrhage or evidence of cortically based acute infarction. Vascular:  Calcified atherosclerosis at the skull base. Skull: No acute osseous abnormality identified. Sinuses/Orbits: Opacification of the right sphenoid sinus is unchanged. Other Visualized paranasal sinuses and mastoids are stable and well pneumatized. Other: No scalp hematoma identified. No acute orbit or scalp soft tissue finding. CT CERVICAL SPINE FINDINGS Alignment: Stable cervical vertebral height and alignment. Cervicothoracic junction alignment is within normal limits. Bilateral posterior element alignment is within normal limits. Mild anterolisthesis at C3-C4, C4-C5, and C5-C6. Associated moderate to severe facet arthropathy, greater on the right. Skull base and vertebrae: Visualized skull base is intact. No atlanto-occipital dissociation. 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 Mild motion artifact through the C3 level. No acute cervical spine fracture identified. Soft tissues and spinal canal: No prevertebral fluid or swelling. No visible canal hematoma. Negative for age noncontrast CT appearance of the neck. Disc levels: Chronic mid and lower cervical  spine disc space loss and endplate degeneration appears stable. Moderate to severe right greater than left facet arthropathy. Evidence of chronic interbody and posterior element ankylosis at C6-C7. Upper chest: Visualized upper thoracic levels appear intact. Stable mild apical lung scarring. IMPRESSION: 1. No acute intracranial abnormality. Stable non contrast CT appearance of the brain. 2. Stable CT appearance of the cervical spine, no acute fracture or listhesis identified. 3.  No acute traumatic injury identified. 4. Advanced cervical facet arthropathy in the setting of multilevel mild upper cervical spondylolisthesis. Evidence of chronic C6-C7 ankylosis, with adjacent segment advanced disc and endplate degeneration. Electronically Signed   By: Genevie Ann M.D.   On: 03/22/2016 12:47   Dg Hand Complete Right  Result Date:  03/22/2016 CLINICAL DATA:  Fall, right hand laceration and pain, initial encounter. EXAM: RIGHT HAND - COMPLETE 3+ VIEW COMPARISON:  None. FINDINGS: Hand is contracted, limiting evaluation.  No definite fracture. IMPRESSION: Hand is contracted, limiting evaluation.  No definite fracture. Electronically Signed   By: Lorin Picket M.D.   On: 03/22/2016 12:29    Assessment/Plan 1. Multiple skin tears Bactroban and cover daily  2. Cutaneous abscess of buttock Continue current treatment  3. Fall, initial encounter High risk for further falls  4. Vascular dementia with behavior disturbance Unchanged Recommend move to SNF  5. Abnormality of gait High risk for falls  6. Essential hypertension controlled

## 2016-03-28 ENCOUNTER — Non-Acute Institutional Stay (SKILLED_NURSING_FACILITY): Payer: Medicare Other | Admitting: Nurse Practitioner

## 2016-03-28 ENCOUNTER — Encounter: Payer: Self-pay | Admitting: Nurse Practitioner

## 2016-03-28 DIAGNOSIS — I10 Essential (primary) hypertension: Secondary | ICD-10-CM

## 2016-03-28 DIAGNOSIS — T148XXA Other injury of unspecified body region, initial encounter: Secondary | ICD-10-CM

## 2016-03-28 DIAGNOSIS — F0151 Vascular dementia with behavioral disturbance: Secondary | ICD-10-CM

## 2016-03-28 DIAGNOSIS — W19XXXD Unspecified fall, subsequent encounter: Secondary | ICD-10-CM

## 2016-03-28 DIAGNOSIS — F01518 Vascular dementia, unspecified severity, with other behavioral disturbance: Secondary | ICD-10-CM

## 2016-03-28 NOTE — Assessment & Plan Note (Signed)
No s/s of infection, continue wound care protocol.

## 2016-03-28 NOTE — Progress Notes (Signed)
Location:  [facility] Room Number 02 Place of Service:  SNF (31) Provider:  Islah Eve, [place]  NP  Jeanmarie Hubert, MD  Patient Care Team: Estill Dooms, MD as PCP - General (Internal Medicine) Glennie Isle, PA-C as Consulting Physician (Dermatology) Shon Hough, MD as Consulting Physician (Ophthalmology) Demetria Lightsey Otho Darner, NP as Nurse Practitioner (Internal Medicine) Pieter Partridge, DO as Consulting Physician (Neurology) Ladene Artist, MD as Consulting Physician (Gastroenterology)  Extended Emergency Contact Information Primary Emergency Contact: Funk,Susan Address: [place] [address] Johnnette Litter of [place] Phone: [phone] Mobile Phone: [phone] Relation: Daughter Secondary Emergency Contact: Tierney,John Address: [address] [place] [address] [place] of [place] Phone: [phone] Relation: Son  Code Status:  DNR Goals of care: Advanced Directive information Advanced Directives 03/28/2016  Does Patient Have a Medical Advance Directive? Yes  Type of Paramedic of [place];Out of facility DNR (pink MOST or yellow form)  Does patient want to make changes to medical advance directive? No - Patient declined  Copy of [place] in Chart? Yes  Would patient like information on creating a medical advance directive? -  Pre-existing out of facility DNR order (yellow form or pink MOST form) Yellow form placed in chart (order not valid for inpatient use)     Chief Complaint  Patient presents with  . Acute Visit    Broken thumb,    HPI:  Pt is a 82 y.o. male seen today for an acute visit for multiple skin tears BUE and lower back pain sustained from frequent falling, progression of dementia resulted in lacking of safety awareness and resistance to assistance.   ED eval for fall 03/22/16, CT head/cervical spine no acute intracranial  abnormality, R hand X-ray showed no evidence of fxs.   Hx of dementia, resides in [place] with wife, he has a Charity fundraiser, not taking memory preserving meds. Blood pressure is controlled while on Lisinopril/HCT 10/12.5mg .   Past Medical History:  Diagnosis Date  . Abnormality of gait 12/14/2015  . ACUT SUPPRATV OTITIS MEDIA W/O SPONT RUP EARDRUM 09/01/2008   patient denies rupture  . ANKLE INJURY 02/28/2010  . Bradycardia   . Cerebral atrophy 11/16/2015  . Cerebrovascular disease 11/16/2015  . DEPRESSION 01/02/2007   patient denies history  . Diverticulosis of colon without hemorrhage 11/16/2015  . DJD (degenerative joint disease)   . GLAUCOMA NOS 01/03/2007  . Hx of adenomatous colonic polyps 11/16/2015  . HYPERLIPIDEMIA 01/02/2007  . HYPERTENSION 10/30/2007  . Memory loss   . Multiple skin tears 03/24/2016  . PROSTATE CANCER, HX OF 01/02/2007  . Slow transit constipation 11/04/2010   The pt has noted worsening constipation over the last year Small pellet stools No pain Three days between BMs   . VENOUS INSUFFICIENCY, CHRONIC 01/03/2007   Past Surgical History:  Procedure Laterality Date  . COLONOSCOPY  05/12/2005   polyps, diverticulosis, hemorrhoids; Dr. Fuller Plan  . lymph node removal     associated with prostate cancer    No Known Allergies  Allergies as of 03/28/2016   No Known Allergies     Medication List       Accurate as of 03/28/16  2:57 PM. Always use your most recent med list.          acetaminophen 650 MG CR tablet Commonly known as:  TYLENOL 650 mg. Special instructions: standing order may give x48 hours as needed.   carteolol 1 % ophthalmic solution Commonly known as:  OCUPRESS Place 1 drop into both eyes 2 (two) times daily.   doxycycline 100 MG capsule Commonly known as:  MONODOX Take 100 mg by mouth 2 (two) times daily.   lisinopril-hydrochlorothiazide 10-12.5 MG tablet Commonly known as:  PRINZIDE,ZESTORETIC Take 1 tablet by mouth. Take one tablet daily for  blood pressure   polyvinyl alcohol 1.4 % ophthalmic solution Commonly known as:  LIQUIFILM TEARS Place 1 drop into both eyes 4 (four) times daily as needed for dry eyes.   saccharomyces boulardii 250 MG capsule Commonly known as:  FLORASTOR Take 250 mg by mouth 2 (two) times daily.   TRAVATAN Z 0.004 % Soln ophthalmic solution Generic drug:  Travoprost (BAK Free) Place 1 drop into both eyes at bedtime.   zinc oxide 20 % ointment Apply 1 application topically. Apply to reddened areas to buttocks to help prevent ski breakdown until resolved.       Review of Systems  Constitutional: Positive for fatigue. Negative for activity change, appetite change, fever and unexpected weight change.  HENT: Positive for hearing loss. Negative for congestion, ear pain, rhinorrhea, sore throat, tinnitus, trouble swallowing and voice change.   Eyes:       Corrective lenses  Respiratory: Negative for cough, choking, chest tightness, shortness of breath and wheezing.   Cardiovascular: Positive for leg swelling. Negative for chest pain and palpitations.  Gastrointestinal: Positive for constipation. Negative for abdominal distention, abdominal pain, diarrhea and nausea.  Endocrine: Negative for cold intolerance, heat intolerance, polydipsia, polyphagia and polyuria.  Genitourinary: Negative for dysuria, frequency, testicular pain and urgency.       Not incontinent. Hx of prostate cancer.  Musculoskeletal: Positive for back pain and gait problem (using walker). Negative for arthralgias, myalgias and neck pain.       Chronic contracture of the right fifth finger PIP joint. Dupuytren's contracture of the fifth finger bilaterally. Lower back pain  Skin: Positive for wound (multiple skin tears of the right arm and left hand). Negative for color change, pallor and rash.       BUE skin tears from falling  Allergic/Immunologic: Negative.   Neurological: Negative for dizziness, tremors, syncope, speech difficulty,  weakness, numbness and headaches.       Severe Dementia with behavioral challenges.  Hematological: Negative for adenopathy. Does not bruise/bleed easily.  Psychiatric/Behavioral: Positive for behavioral problems, confusion, decreased concentration and dysphoric mood. Negative for hallucinations and sleep disturbance. The patient is not nervous/anxious.     Immunization History  Administered Date(s) Administered  . Influenza Split 01/10/2011, 01/05/2012  . Influenza Whole 04/10/1997, 01/27/2009, 02/03/2010  . Influenza, High Dose Seasonal PF 01/09/2013  . Influenza,inj,Quad PF,36+ Mos 12/28/2014  . Influenza-Unspecified 01/31/2014, 03/09/2015  . PPD Test 01/10/2011  . Pneumococcal Polysaccharide-23 04/10/1996, 01/10/2011  . Td 04/10/1996, 01/03/2007  . Tdap 11/29/2010   Pertinent  Health Maintenance Due  Topic Date Due  . PNA vac Low Risk Adult (2 of 2 - PCV13) 01/10/2012  . INFLUENZA VACCINE  11/09/2015   Fall Risk  03/24/2016 11/16/2015 11/16/2015 10/13/2015 08/28/2014  Falls in the past year? Yes Yes No No No  Number falls in past yr: 2 or more 1 - - -  Injury with Fall? Yes Yes - - -  Risk Factor Category  High Fall Risk - - - -  Risk for fall due to : Impaired balance/gait;Impaired mobility;Mental  status change;History of fall(s) - - - -  Follow up Falls evaluation completed - - - -   Functional Status Survey:    Vitals:   03/28/16 1209  BP: (!) 158/80  Pulse: 88  Resp: 18  Temp: 97.4 F (36.3 C)  SpO2: 95%  Weight: 160 lb 3.2 oz (72.7 kg)  Height: 6' (1.829 m)   Body mass index is 21.73 kg/m. Physical Exam  Constitutional: He appears well-developed and well-nourished. No distress.  HENT:  Right Ear: External ear normal.  Left Ear: External ear normal.  Nose: Nose normal.  Mouth/Throat: Oropharynx is clear and moist. No oropharyngeal exudate.  Bilateral hearing loss  Eyes: Conjunctivae and EOM are normal. Pupils are equal, round, and reactive to light.  Neck: No  JVD present. No tracheal deviation present. No thyromegaly present.  Cardiovascular: Normal rate, regular rhythm, normal heart sounds and intact distal pulses.  Exam reveals no gallop and no friction rub.   No murmur heard. Pulmonary/Chest: No respiratory distress. He has no wheezes. He has no rales. He exhibits no tenderness.  Abdominal: He exhibits no distension and no mass. There is no tenderness.  Musculoskeletal: Normal range of motion. He exhibits edema (1+ bipedal) and tenderness.  unstable gait, lower back pain.   Lymphadenopathy:    He has no cervical adenopathy.  Neurological: He is alert. He has normal reflexes. No cranial nerve deficit. Coordination abnormal.  Severely dementia 04/29/15 MMSE 21/30. Failed clockdrawing.  Skin: No rash noted. No erythema. No pallor.  Multiple skin tears BLU  Psychiatric: His speech is normal. His affect is labile and inappropriate. He is agitated, aggressive and combative. Cognition and memory are impaired. He expresses impulsivity and inappropriate judgment. He exhibits abnormal recent memory and abnormal remote memory.  Episodes of agitation and anger. Improved since on risperidone. He is inattentive.    Labs reviewed:  Recent Labs  06/21/15 1328 07/22/15 1446 11/29/15 12/30/15  NA 139 140 140 140  K 4.3 3.8 4.1 4.0  CL 103 106  --   --   CO2 31 25  --   --   GLUCOSE 114* 130*  --   --   BUN 25* 23* 25* 23*  CREATININE 1.03 0.90 1.0 0.9  CALCIUM 9.1 8.7*  --   --     Recent Labs  05/13/15 07/22/15 1446  AST 16 20  ALT 10 15*  ALKPHOS 83 51  BILITOT  --  1.3*  PROT  --  7.0  ALBUMIN  --  3.6    Recent Labs  05/13/15 07/22/15 1446 10/27/15 1138  WBC 5.1 11.6* 6.1  NEUTROABS  --  8.9* 3.6  HGB 12.1* 12.7* 13.8  HCT 36* 37.4* 41.2  MCV  --  97.1 97.4  PLT 258 268 271.0   Lab Results  Component Value Date   TSH 1.31 11/23/2014   No results found for: HGBA1C Lab Results  Component Value Date   CHOL 198 04/13/2014    HDL 52.80 04/13/2014   LDLCALC 128 (H) 04/13/2014   LDLDIRECT 113.5 10/09/2011   TRIG 85.0 04/13/2014   CHOLHDL 4 04/13/2014    Significant Diagnostic Results in last 30 days:  Ct Head Wo Contrast  Result Date: 03/22/2016 CLINICAL DATA:  82 year old male status post fall. Uncooperative during imaging. Initial encounter. EXAM: CT HEAD WITHOUT CONTRAST CT CERVICAL SPINE WITHOUT CONTRAST TECHNIQUE: Multidetector CT imaging of the head and cervical spine was performed following the standard protocol without intravenous contrast. Multiplanar CT  image reconstructions of the cervical spine were also generated. COMPARISON:  CT head and cervical spine 07/11/2015. Brain MRI 07/23/2015. FINDINGS: CT HEAD FINDINGS Brain: Stable cerebral volume. Dural calcifications including along the interhemispheric fissure. Patchy and confluent cerebral white matter hypodensity and bilateral deep gray matter heterogeneity. Stable gray-white matter differentiation throughout the brain. No midline shift, ventriculomegaly, mass effect, evidence of mass lesion, intracranial hemorrhage or evidence of cortically based acute infarction. Vascular: Calcified atherosclerosis at the skull base. Skull: No acute osseous abnormality identified. Sinuses/Orbits: Opacification of the right sphenoid sinus is unchanged. Other Visualized paranasal sinuses and mastoids are stable and well pneumatized. Other: No scalp hematoma identified. No acute orbit or scalp soft tissue finding. CT CERVICAL SPINE FINDINGS Alignment: Stable cervical vertebral height and alignment. Cervicothoracic junction alignment is within normal limits. Bilateral posterior element alignment is within normal limits. Mild anterolisthesis at C3-C4, C4-C5, and C5-C6. Associated moderate to severe facet arthropathy, greater on the right. Skull base and vertebrae: Visualized skull base is intact. No atlanto-occipital dissociation. 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 Mild motion artifact through the C3 level. No acute cervical spine fracture identified. Soft tissues and spinal canal: No prevertebral fluid or swelling. No visible canal hematoma. Negative for age noncontrast CT appearance of the neck. Disc levels: Chronic mid and lower cervical spine disc space loss and endplate degeneration appears stable. Moderate to severe right greater than left facet arthropathy. Evidence of chronic interbody and posterior element ankylosis at C6-C7. Upper chest: Visualized upper thoracic levels appear intact. Stable mild apical lung scarring. IMPRESSION: 1. No acute intracranial abnormality. Stable non contrast CT appearance of the brain. 2. Stable CT appearance of the cervical spine, no acute fracture or listhesis identified. 3.  No acute traumatic injury identified. 4. Advanced cervical facet arthropathy in the setting of multilevel mild upper cervical spondylolisthesis. Evidence of chronic C6-C7 ankylosis, with adjacent segment advanced disc and endplate degeneration. Electronically Signed   By: Genevie Ann M.D.   On: 03/22/2016 12:47   Ct Cervical Spine Wo Contrast  Result Date: 03/22/2016 CLINICAL DATA:  82 year old male status post fall. Uncooperative during imaging. Initial encounter. EXAM: CT HEAD WITHOUT CONTRAST CT CERVICAL SPINE WITHOUT CONTRAST TECHNIQUE: Multidetector CT imaging of the head and cervical spine was performed following the standard protocol without intravenous contrast. Multiplanar CT image reconstructions of the cervical spine were also generated. COMPARISON:  CT head and cervical spine 07/11/2015. Brain MRI 07/23/2015. FINDINGS: CT HEAD FINDINGS Brain: Stable cerebral volume. Dural calcifications including along the interhemispheric fissure. Patchy and confluent cerebral white matter hypodensity and bilateral deep gray matter heterogeneity. Stable gray-white matter differentiation throughout the brain. No midline shift, ventriculomegaly, mass  effect, evidence of mass lesion, intracranial hemorrhage or evidence of cortically based acute infarction. Vascular: Calcified atherosclerosis at the skull base. Skull: No acute osseous abnormality identified. Sinuses/Orbits: Opacification of the right sphenoid sinus is unchanged. Other Visualized paranasal sinuses and mastoids are stable and well pneumatized. Other: No scalp hematoma identified. No acute orbit or scalp soft tissue finding. CT CERVICAL SPINE FINDINGS Alignment: Stable cervical vertebral height and alignment. Cervicothoracic junction alignment is within normal limits. Bilateral posterior element alignment is within normal limits. Mild anterolisthesis at C3-C4, C4-C5, and C5-C6. Associated moderate to severe facet arthropathy, greater on the right. Skull base and vertebrae: Visualized skull base is intact. No atlanto-occipital dissociation. 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 Mild motion artifact through the C3 level. No acute cervical spine fracture identified.  Soft tissues and spinal canal: No prevertebral fluid or swelling. No visible canal hematoma. Negative for age noncontrast CT appearance of the neck. Disc levels: Chronic mid and lower cervical spine disc space loss and endplate degeneration appears stable. Moderate to severe right greater than left facet arthropathy. Evidence of chronic interbody and posterior element ankylosis at C6-C7. Upper chest: Visualized upper thoracic levels appear intact. Stable mild apical lung scarring. IMPRESSION: 1. No acute intracranial abnormality. Stable non contrast CT appearance of the brain. 2. Stable CT appearance of the cervical spine, no acute fracture or listhesis identified. 3.  No acute traumatic injury identified. 4. Advanced cervical facet arthropathy in the setting of multilevel mild upper cervical spondylolisthesis. Evidence of chronic C6-C7 ankylosis, with adjacent segment advanced disc and endplate degeneration.  Electronically Signed   By: Genevie Ann M.D.   On: 03/22/2016 12:47   Dg Hand Complete Right  Result Date: 03/22/2016 CLINICAL DATA:  Fall, right hand laceration and pain, initial encounter. EXAM: RIGHT HAND - COMPLETE 3+ VIEW COMPARISON:  None. FINDINGS: Hand is contracted, limiting evaluation.  No definite fracture. IMPRESSION: Hand is contracted, limiting evaluation.  No definite fracture. Electronically Signed   By: Lorin Picket M.D.   On: 03/22/2016 12:29    Assessment/Plan Dementia with behavioral disturbance Has seen neurology- prn follow up only. No aricept. 12/30/15 Na 140, K 4.0, Bun 23, creat 0.92, update CBC CMP TSH UA C/S   Essential hypertension Controlled, continue lisinopril/HCT 10/12.5mg . 12/30/15 Na 140, K 4.0, Bun 23, creat 0.92    Multiple skin tears No s/s of infection, continue wound care protocol.   Fall Continue intensive supervision, recommend SNF for higher level of care.      Family/ staff Communication:AL  Labs/tests ordered:  CBC CMP TSH UA C/S

## 2016-03-28 NOTE — Assessment & Plan Note (Signed)
Continue intensive supervision, recommend SNF for higher level of care.

## 2016-03-28 NOTE — Assessment & Plan Note (Signed)
Has seen neurology- prn follow up only. No aricept. 12/30/15 Na 140, K 4.0, Bun 23, creat 0.92, update CBC CMP TSH UA C/S

## 2016-03-28 NOTE — Assessment & Plan Note (Signed)
Controlled, continue lisinopril/HCT 10/12.5mg . 12/30/15 Na 140, K 4.0, Bun 23, creat 0.92

## 2016-03-30 DIAGNOSIS — I872 Venous insufficiency (chronic) (peripheral): Secondary | ICD-10-CM | POA: Diagnosis not present

## 2016-03-30 DIAGNOSIS — I10 Essential (primary) hypertension: Secondary | ICD-10-CM | POA: Diagnosis not present

## 2016-03-30 DIAGNOSIS — M6281 Muscle weakness (generalized): Secondary | ICD-10-CM | POA: Diagnosis not present

## 2016-03-30 LAB — HEPATIC FUNCTION PANEL
ALK PHOS: 68 U/L (ref 25–125)
ALT: 16 U/L (ref 10–40)
AST: 22 U/L (ref 14–40)
BILIRUBIN, TOTAL: 0.5 mg/dL

## 2016-03-30 LAB — BASIC METABOLIC PANEL
BUN: 73 mg/dL — AB (ref 4–21)
CREATININE: 1.4 mg/dL — AB (ref <=1.3)
GLUCOSE: 105 mg/dL
Potassium: 4.6 mmol/L (ref 3.4–5.3)
Sodium: 145 mmol/L (ref 137–147)

## 2016-03-30 LAB — CBC AND DIFFERENTIAL
HEMATOCRIT: 39 % — AB (ref 41–53)
Hemoglobin: 13.3 g/dL — AB (ref 13.5–17.5)
PLATELETS: 288 10*3/uL (ref 150–399)
WBC: 6.5 10*3/mL

## 2016-03-30 LAB — TSH: TSH: 1.45 u[IU]/mL (ref <=5.90)

## 2016-03-31 ENCOUNTER — Encounter: Payer: Self-pay | Admitting: Nurse Practitioner

## 2016-03-31 ENCOUNTER — Other Ambulatory Visit: Payer: Self-pay | Admitting: *Deleted

## 2016-03-31 DIAGNOSIS — N189 Chronic kidney disease, unspecified: Secondary | ICD-10-CM | POA: Insufficient documentation

## 2016-04-04 ENCOUNTER — Encounter: Payer: Self-pay | Admitting: Internal Medicine

## 2016-04-06 DIAGNOSIS — I10 Essential (primary) hypertension: Secondary | ICD-10-CM | POA: Diagnosis not present

## 2016-04-06 DIAGNOSIS — I509 Heart failure, unspecified: Secondary | ICD-10-CM | POA: Diagnosis not present

## 2016-04-06 LAB — BASIC METABOLIC PANEL
BUN: 53 mg/dL — AB (ref 4–21)
CREATININE: 1.2 mg/dL (ref <=1.3)
Glucose: 91 mg/dL
POTASSIUM: 4 mmol/L (ref 3.4–5.3)
SODIUM: 142 mmol/L (ref 137–147)

## 2016-04-12 ENCOUNTER — Other Ambulatory Visit: Payer: Self-pay | Admitting: *Deleted

## 2016-04-13 DIAGNOSIS — K5900 Constipation, unspecified: Secondary | ICD-10-CM | POA: Diagnosis not present

## 2016-04-13 DIAGNOSIS — H44519 Absolute glaucoma, unspecified eye: Secondary | ICD-10-CM | POA: Diagnosis not present

## 2016-04-13 DIAGNOSIS — M6281 Muscle weakness (generalized): Secondary | ICD-10-CM | POA: Diagnosis not present

## 2016-04-13 DIAGNOSIS — R293 Abnormal posture: Secondary | ICD-10-CM | POA: Diagnosis not present

## 2016-04-13 DIAGNOSIS — I872 Venous insufficiency (chronic) (peripheral): Secondary | ICD-10-CM | POA: Diagnosis not present

## 2016-04-13 DIAGNOSIS — R001 Bradycardia, unspecified: Secondary | ICD-10-CM | POA: Diagnosis not present

## 2016-04-13 DIAGNOSIS — M25511 Pain in right shoulder: Secondary | ICD-10-CM | POA: Diagnosis not present

## 2016-04-13 DIAGNOSIS — R296 Repeated falls: Secondary | ICD-10-CM | POA: Diagnosis not present

## 2016-04-13 DIAGNOSIS — R2689 Other abnormalities of gait and mobility: Secondary | ICD-10-CM | POA: Diagnosis not present

## 2016-04-13 DIAGNOSIS — I10 Essential (primary) hypertension: Secondary | ICD-10-CM | POA: Diagnosis not present

## 2016-04-18 DIAGNOSIS — M6281 Muscle weakness (generalized): Secondary | ICD-10-CM | POA: Diagnosis not present

## 2016-04-18 DIAGNOSIS — I872 Venous insufficiency (chronic) (peripheral): Secondary | ICD-10-CM | POA: Diagnosis not present

## 2016-04-18 DIAGNOSIS — R296 Repeated falls: Secondary | ICD-10-CM | POA: Diagnosis not present

## 2016-04-18 DIAGNOSIS — H44519 Absolute glaucoma, unspecified eye: Secondary | ICD-10-CM | POA: Diagnosis not present

## 2016-04-18 DIAGNOSIS — R2689 Other abnormalities of gait and mobility: Secondary | ICD-10-CM | POA: Diagnosis not present

## 2016-04-18 DIAGNOSIS — I10 Essential (primary) hypertension: Secondary | ICD-10-CM | POA: Diagnosis not present

## 2016-04-18 DIAGNOSIS — R293 Abnormal posture: Secondary | ICD-10-CM | POA: Diagnosis not present

## 2016-04-18 DIAGNOSIS — K5900 Constipation, unspecified: Secondary | ICD-10-CM | POA: Diagnosis not present

## 2016-04-18 DIAGNOSIS — R001 Bradycardia, unspecified: Secondary | ICD-10-CM | POA: Diagnosis not present

## 2016-04-18 DIAGNOSIS — M25511 Pain in right shoulder: Secondary | ICD-10-CM | POA: Diagnosis not present

## 2016-04-25 ENCOUNTER — Encounter: Payer: Self-pay | Admitting: Nurse Practitioner

## 2016-04-25 ENCOUNTER — Non-Acute Institutional Stay (SKILLED_NURSING_FACILITY): Payer: Medicare Other | Admitting: Nurse Practitioner

## 2016-04-25 DIAGNOSIS — M25511 Pain in right shoulder: Secondary | ICD-10-CM | POA: Diagnosis not present

## 2016-04-25 DIAGNOSIS — K5900 Constipation, unspecified: Secondary | ICD-10-CM | POA: Diagnosis not present

## 2016-04-25 DIAGNOSIS — H44519 Absolute glaucoma, unspecified eye: Secondary | ICD-10-CM | POA: Diagnosis not present

## 2016-04-25 DIAGNOSIS — R001 Bradycardia, unspecified: Secondary | ICD-10-CM | POA: Diagnosis not present

## 2016-04-25 DIAGNOSIS — M6281 Muscle weakness (generalized): Secondary | ICD-10-CM | POA: Diagnosis not present

## 2016-04-25 DIAGNOSIS — I872 Venous insufficiency (chronic) (peripheral): Secondary | ICD-10-CM | POA: Diagnosis not present

## 2016-04-25 DIAGNOSIS — R627 Adult failure to thrive: Secondary | ICD-10-CM | POA: Diagnosis not present

## 2016-04-25 DIAGNOSIS — I10 Essential (primary) hypertension: Secondary | ICD-10-CM

## 2016-04-25 DIAGNOSIS — F01518 Vascular dementia, unspecified severity, with other behavioral disturbance: Secondary | ICD-10-CM

## 2016-04-25 DIAGNOSIS — R2689 Other abnormalities of gait and mobility: Secondary | ICD-10-CM | POA: Diagnosis not present

## 2016-04-25 DIAGNOSIS — R296 Repeated falls: Secondary | ICD-10-CM | POA: Diagnosis not present

## 2016-04-25 DIAGNOSIS — F0151 Vascular dementia with behavioral disturbance: Secondary | ICD-10-CM

## 2016-04-25 DIAGNOSIS — R293 Abnormal posture: Secondary | ICD-10-CM | POA: Diagnosis not present

## 2016-04-25 NOTE — Assessment & Plan Note (Signed)
Controlled, continue lisinopril/HCT 10/12.5mg . 12/30/15 Na 140, K 4.0, Bun 23, creat 0.92

## 2016-04-25 NOTE — Assessment & Plan Note (Signed)
Has seen neurology- prn follow up only. No aricept.

## 2016-04-25 NOTE — Assessment & Plan Note (Signed)
Continue SNF for care needs, may consider Hospice, adding Mirtazapine 15mg  for appetite and mood, observe the patient.

## 2016-04-25 NOTE — Progress Notes (Signed)
Location: [facility] Room Number 02 Place of Service: [facility] [phone]) Provider: Mast, Manxie  NP  Jeanmarie Hubert, MD  Patient Care Team: Estill Dooms, MD as PCP - General (Internal Medicine) Glennie Isle, PA-C as Consulting Physician (Dermatology) Shon Hough, MD as Consulting Physician (Ophthalmology) Man Otho Darner, NP as Nurse Practitioner (Internal Medicine) Pieter Partridge, DO as Consulting Physician (Neurology) Ladene Artist, MD as Consulting Physician (Gastroenterology)  Extended Emergency Contact Information Primary Emergency Contact: Funk,Susan Address: [place] [address] Johnnette Litter of [place] Phone: [phone] Mobile Phone: [phone] Relation: Daughter Secondary Emergency Contact: Esh,John Address: [address] [place] [address] [place] of [place] Phone: [phone] Relation: Son  Code Status:  DNR Goals of care: Advanced Directive information Advanced Directives 04/25/2016  Does Patient Have a Medical Advance Directive? Yes  Type of Paramedic of [place];Out of facility DNR (pink MOST or yellow form)  Does patient want to make changes to medical advance directive? No - Patient declined  Copy of [place] in Chart? Yes  Would patient like information on creating a medical advance directive? -  Pre-existing out of facility DNR order (yellow form or pink MOST form) Yellow form placed in chart (order not valid for inpatient use)     Chief Complaint  Patient presents with  . Medical Management of Chronic Issues    HPI:  Pt is a 83 y.o. male seen today for medical management of chronic diseases.    Weight loss about 11Ibs in the past month, ADL dependency, mood instability, refusal of care, confusion, no noted constitutional symptoms.   ED eval for fall 03/22/16, CT head/cervical spine no acute intracranial  abnormality, R hand X-ray showed no evidence of fxs.              Hx of dementia, progressing, transferred to SNF for care needs, not taking memory preserving meds. Blood pressure is controlled while on Lisinopril/HCT 10/12.5mg .   Past Medical History:  Diagnosis Date  . Abnormality of gait 12/14/2015  . ACUT SUPPRATV OTITIS MEDIA W/O SPONT RUP EARDRUM 09/01/2008   patient denies rupture  . ANKLE INJURY 02/28/2010  . Bradycardia   . Cerebral atrophy 11/16/2015  . Cerebrovascular disease 11/16/2015  . DEPRESSION 01/02/2007   patient denies history  . Diverticulosis of colon without hemorrhage 11/16/2015  . DJD (degenerative joint disease)   . GLAUCOMA NOS 01/03/2007  . Hx of adenomatous colonic polyps 11/16/2015  . HYPERLIPIDEMIA 01/02/2007  . HYPERTENSION 10/30/2007  . Memory loss   . Multiple skin tears 03/24/2016  . PROSTATE CANCER, HX OF 01/02/2007  . Slow transit constipation 11/04/2010   The pt has noted worsening constipation over the last year Small pellet stools No pain Three days between BMs   . VENOUS INSUFFICIENCY, CHRONIC 01/03/2007   Past Surgical History:  Procedure Laterality Date  . COLONOSCOPY  05/12/2005   polyps, diverticulosis, hemorrhoids; Dr. Fuller Plan  . lymph node removal     associated with prostate cancer    No Known Allergies  Allergies as of 04/25/2016   No Known Allergies     Medication List       Accurate as of 04/25/16  3:09 PM. Always use your most recent med list.          acetaminophen 650 MG  CR tablet Commonly known as:  TYLENOL 650 mg. Special instructions: standing order may give x48 hours as needed.   carteolol 1 % ophthalmic solution Commonly known as:  OCUPRESS Place 1 drop into both eyes 2 (two) times daily.   lisinopril-hydrochlorothiazide 10-12.5 MG tablet Commonly known as:  PRINZIDE,ZESTORETIC Take 1 tablet by mouth. Take one tablet daily for blood pressure   polyvinyl alcohol 1.4 % ophthalmic solution Commonly known as:  LIQUIFILM  TEARS Place 1 drop into both eyes 4 (four) times daily as needed for dry eyes.   TRAVATAN Z 0.004 % Soln ophthalmic solution Generic drug:  Travoprost (BAK Free) Place 1 drop into both eyes at bedtime.   zinc oxide 20 % ointment Apply 1 application topically. Apply to reddened areas to buttocks to help prevent ski breakdown until resolved.       Review of Systems  Constitutional: Positive for activity change, appetite change, fatigue and unexpected weight change. Negative for fever.  HENT: Positive for hearing loss. Negative for congestion, ear pain, rhinorrhea, sore throat, tinnitus, trouble swallowing and voice change.   Eyes:       Corrective lenses  Respiratory: Negative for cough, choking, chest tightness, shortness of breath and wheezing.   Cardiovascular: Positive for leg swelling. Negative for chest pain and palpitations.  Gastrointestinal: Positive for constipation. Negative for abdominal distention, abdominal pain, diarrhea and nausea.  Endocrine: Negative for cold intolerance, heat intolerance, polydipsia, polyphagia and polyuria.  Genitourinary: Negative for dysuria, frequency, testicular pain and urgency.       Not incontinent. Hx of prostate cancer.  Musculoskeletal: Positive for back pain and gait problem (using walker). Negative for arthralgias, myalgias and neck pain.       Chronic contracture of the right fifth finger PIP joint. Dupuytren's contracture of the fifth finger bilaterally. Lower back pain  Skin: Positive for wound (multiple skin tears of the right arm and left hand). Negative for color change, pallor and rash.       BUE skin tears from falling  Allergic/Immunologic: Negative.   Neurological: Negative for dizziness, tremors, syncope, speech difficulty, weakness, numbness and headaches.       Severe Dementia with behavioral challenges.  Hematological: Negative for adenopathy. Does not bruise/bleed easily.  Psychiatric/Behavioral: Positive for behavioral  problems, confusion, decreased concentration and dysphoric mood. Negative for hallucinations and sleep disturbance. The patient is not nervous/anxious.     Immunization History  Administered Date(s) Administered  . Influenza Split 01/10/2011, 01/05/2012  . Influenza Whole 04/10/1997, 01/27/2009, 02/03/2010  . Influenza, High Dose Seasonal PF 01/09/2013  . Influenza,inj,Quad PF,36+ Mos 12/28/2014  . Influenza-Unspecified 01/31/2014, 03/09/2015  . PPD Test 01/10/2011  . Pneumococcal Polysaccharide-23 04/10/1996, 01/10/2011  . Td 04/10/1996, 01/03/2007  . Tdap 11/29/2010   Pertinent  Health Maintenance Due  Topic Date Due  . PNA vac Low Risk Adult (2 of 2 - PCV13) 01/10/2012  . INFLUENZA VACCINE  11/09/2015   Fall Risk  03/24/2016 11/16/2015 11/16/2015 10/13/2015 08/28/2014  Falls in the past year? Yes Yes No No No  Number falls in past yr: 2 or more 1 - - -  Injury with Fall? Yes Yes - - -  Risk Factor Category  High Fall Risk - - - -  Risk for fall due to : Impaired balance/gait;Impaired mobility;Mental status change;History of fall(s) - - - -  Follow up Falls evaluation completed - - - -   Functional Status Survey:    Vitals:   04/25/16 1432  BP: (!) 148/84  Pulse: 82  Resp: 20  Temp: 97.6 F (36.4 C)  SpO2: 95%  Weight: 149 lb (67.6 kg)  Height: 6' (1.829 m)   Body mass index is 20.21 kg/m. Physical Exam  Constitutional: He appears well-developed and well-nourished. No distress.  HENT:  Right Ear: External ear normal.  Left Ear: External ear normal.  Nose: Nose normal.  Mouth/Throat: Oropharynx is clear and moist. No oropharyngeal exudate.  Bilateral hearing loss  Eyes: Conjunctivae and EOM are normal. Pupils are equal, round, and reactive to light.  Neck: No JVD present. No tracheal deviation present. No thyromegaly present.  Cardiovascular: Normal rate, regular rhythm, normal heart sounds and intact distal pulses.  Exam reveals no gallop and no friction rub.   No  murmur heard. Pulmonary/Chest: No respiratory distress. He has no wheezes. He has no rales. He exhibits no tenderness.  Abdominal: He exhibits no distension and no mass. There is no tenderness.  Musculoskeletal: Normal range of motion. He exhibits edema (1+ bipedal) and tenderness.  unstable gait, lower back pain.   Lymphadenopathy:    He has no cervical adenopathy.  Neurological: He is alert. He has normal reflexes. No cranial nerve deficit. Coordination abnormal.  Severely dementia 04/29/15 MMSE 21/30. Failed clockdrawing.  Skin: No rash noted. No erythema. No pallor.  Multiple skin tears BLU  Psychiatric: His speech is normal. His affect is labile and inappropriate. He is agitated, aggressive and combative. Cognition and memory are impaired. He expresses impulsivity and inappropriate judgment. He exhibits abnormal recent memory and abnormal remote memory.  Episodes of agitation and anger.  He is inattentive.    Labs reviewed:  Recent Labs  06/21/15 1328 07/22/15 1446  12/30/15 03/30/16 04/06/16  NA 139 140  < > 140 145 142  K 4.3 3.8  < > 4.0 4.6 4.0  CL 103 106  --   --   --   --   CO2 31 25  --   --   --   --   GLUCOSE 114* 130*  --   --   --   --   BUN 25* 23*  < > 23* 73* 53*  CREATININE 1.03 0.90  < > 0.9 1.4* 1.2  CALCIUM 9.1 8.7*  --   --   --   --   < > = values in this interval not displayed.  Recent Labs  05/13/15 07/22/15 1446 03/30/16  AST 16 20 22   ALT 10 15* 16  ALKPHOS 83 51 68  BILITOT  --  1.3*  --   PROT  --  7.0  --   ALBUMIN  --  3.6  --     Recent Labs  07/22/15 1446 10/27/15 1138 03/30/16  WBC 11.6* 6.1 6.5  NEUTROABS 8.9* 3.6  --   HGB 12.7* 13.8 13.3*  HCT 37.4* 41.2 39*  MCV 97.1 97.4  --   PLT 268 271.0 288   Lab Results  Component Value Date   TSH 1.45 03/30/2016   No results found for: HGBA1C Lab Results  Component Value Date   CHOL 198 04/13/2014   HDL 52.80 04/13/2014   LDLCALC 128 (H) 04/13/2014   LDLDIRECT 113.5  10/09/2011   TRIG 85.0 04/13/2014   CHOLHDL 4 04/13/2014    Significant Diagnostic Results in last 30 days:  No results found.  Assessment/Plan Adult failure to thrive Continue SNF for care needs, may consider Hospice, adding Mirtazapine 15mg  for appetite and mood, observe the patient.   Essential  hypertension Controlled, continue lisinopril/HCT 10/12.5mg . 12/30/15 Na 140, K 4.0, Bun 23, creat 0.92    Dementia with behavioral disturbance Has seen neurology- prn follow up only. No aricept.       Family/ staff Communication: SNF  Labs/tests ordered:  none

## 2016-04-27 DIAGNOSIS — I10 Essential (primary) hypertension: Secondary | ICD-10-CM | POA: Diagnosis not present

## 2016-04-27 DIAGNOSIS — M25511 Pain in right shoulder: Secondary | ICD-10-CM | POA: Diagnosis not present

## 2016-04-27 DIAGNOSIS — R001 Bradycardia, unspecified: Secondary | ICD-10-CM | POA: Diagnosis not present

## 2016-04-27 DIAGNOSIS — R293 Abnormal posture: Secondary | ICD-10-CM | POA: Diagnosis not present

## 2016-04-27 DIAGNOSIS — M6281 Muscle weakness (generalized): Secondary | ICD-10-CM | POA: Diagnosis not present

## 2016-04-27 DIAGNOSIS — I872 Venous insufficiency (chronic) (peripheral): Secondary | ICD-10-CM | POA: Diagnosis not present

## 2016-04-27 DIAGNOSIS — R2689 Other abnormalities of gait and mobility: Secondary | ICD-10-CM | POA: Diagnosis not present

## 2016-04-27 DIAGNOSIS — H44519 Absolute glaucoma, unspecified eye: Secondary | ICD-10-CM | POA: Diagnosis not present

## 2016-04-27 DIAGNOSIS — K5900 Constipation, unspecified: Secondary | ICD-10-CM | POA: Diagnosis not present

## 2016-04-27 DIAGNOSIS — R296 Repeated falls: Secondary | ICD-10-CM | POA: Diagnosis not present

## 2016-04-28 ENCOUNTER — Non-Acute Institutional Stay (SKILLED_NURSING_FACILITY): Payer: Medicare Other | Admitting: Internal Medicine

## 2016-04-28 ENCOUNTER — Encounter: Payer: Self-pay | Admitting: Internal Medicine

## 2016-04-28 DIAGNOSIS — R230 Cyanosis: Secondary | ICD-10-CM

## 2016-04-28 DIAGNOSIS — F0151 Vascular dementia with behavioral disturbance: Secondary | ICD-10-CM

## 2016-04-28 DIAGNOSIS — F01518 Vascular dementia, unspecified severity, with other behavioral disturbance: Secondary | ICD-10-CM

## 2016-04-28 DIAGNOSIS — R627 Adult failure to thrive: Secondary | ICD-10-CM | POA: Diagnosis not present

## 2016-04-28 DIAGNOSIS — Z515 Encounter for palliative care: Secondary | ICD-10-CM | POA: Diagnosis not present

## 2016-04-28 DIAGNOSIS — R001 Bradycardia, unspecified: Secondary | ICD-10-CM | POA: Diagnosis not present

## 2016-05-11 NOTE — Progress Notes (Signed)
History and Physical    Location:  [facility] Room Number: N2 Place of Service:  SNF (31)  PCP: Jeanmarie Hubert, MD Patient Care Team: Estill Dooms, MD as PCP - General (Internal Medicine) Glennie Isle, PA-C as Consulting Physician (Dermatology) Shon Hough, MD as Consulting Physician (Ophthalmology) Man Otho Darner, NP as Nurse Practitioner (Internal Medicine) Pieter Partridge, DO as Consulting Physician (Neurology) Ladene Artist, MD as Consulting Physician (Gastroenterology)  Extended Emergency Contact Information Primary Emergency Contact: Funk,Susan Address: [place] [address] John[place]te [place] Phone: [phone] Mobile Phone: [phone] Relation: Daughter Secondary Emergency Contact: Regner,John Address: [address] [place] [address] [place] Phone: [phone] Relation: Son  Code Status: DNR Goals of Care: Advanced Directive information Advanced Directives May 04, 2016  Does Patient Have a Medical Advance Directive? Yes  Type of Paramedic of [place];Out of facility DNR (pink MOST or yellow form)  Does patient want to make changes to medical advance directive? -  Copy of [place] in Chart? Yes  Would patient like information on creating a medical advance directive? -  Pre-existing out of facility DNR order (yellow form or pink MOST form) Yellow form placed in chart (order not valid for inpatient use);Pink MOST form placed in chart (order not valid for inpatient use)      Chief Complaint  Patient presents with  . New Admit To SNF    transfered from assisted living to skill 03/24/16    HPI: Patient is a 83 y.o. male seen today for admission to [facility] SNF by transfer from [place]. He has severe dementia with behavioral disturbances and became unmanageable on AL. He has declined further since his transfer on  03/24/16. He is DNR and appears to be dying today. BP is not obtainable and his pulse is 35. He has a blue cast to the skin overall and has peripheral vascular shut down. He is barely responding to stimuli.   Past Medical History:  Diagnosis Date  . Abnormality of gait 12/14/2015  . ACUT SUPPRATV OTITIS MEDIA W/O SPONT RUP EARDRUM 09/01/2008   patient denies rupture  . ANKLE INJURY 02/28/2010  . Bradycardia   . Cerebral atrophy 11/16/2015  . Cerebrovascular disease 11/16/2015  . DEPRESSION 01/02/2007   patient denies history  . Diverticulosis of colon without hemorrhage 11/16/2015  . DJD (degenerative joint disease)   . GLAUCOMA NOS 01/03/2007  . Hx of adenomatous colonic polyps 11/16/2015  . HYPERLIPIDEMIA 01/02/2007  . HYPERTENSION 10/30/2007  . Memory loss   . Multiple skin tears 03/24/2016  . PROSTATE CANCER, HX OF 01/02/2007  . Slow transit constipation 11/04/2010   The pt has noted worsening constipation over the last year Small pellet stools No pain Three days between BMs   . VENOUS INSUFFICIENCY, CHRONIC 01/03/2007   Past Surgical History:  Procedure Laterality Date  . COLONOSCOPY  05/12/2005   polyps, diverticulosis, hemorrhoids; Dr. Fuller Plan  . lymph node removal     associated with prostate cancer    reports that he quit smoking about 58 years ago. His smoking use included Cigarettes. He has a 19.00 pack-year smoking history. He has never used smokeless tobacco. He reports that he does not drink alcohol or use drugs. Social History   Social History  . Marital status: Married  Spouse name: N/A  . Number of children: N/A  . Years of education: N/A   Occupational History  . Retired    Social History Main Topics  . Smoking status: Former Smoker    Packs/day: 1.00    Years: 19.00    Types: Cigarettes    Quit date: 10/04/1957  . Smokeless tobacco: Never Used  . Alcohol use No  . Drug use: No  . Sexual activity: Yes   Other Topics Concern  . Not on file   Social History  Narrative   Retired in 1989 from [employer] to [employer] in 89 from [place] (10 years)   Lives with wife. Lives in retirement home-independent living [place].    Does all ADLS and IADLs in [phone].    Married 25 years, lost 1st wife due to cancer. 2 children, wife has 1 children. Son worked for [employer] and lived 3 years ago. 1 grandkid on wife's side, 2 on her side. No great grandkids.       Hobbies: used to play golf until recently, watch tv   POA, DNR, MOST     Family History  Problem Relation Age of Onset  . Prostate cancer Father   . Congestive Heart Failure Brother   . Heart attack Brother   . Stroke Brother   . Alcohol abuse Other   . Cancer Other 50    relative  . Stroke Other 50    relative  . Prostate cancer Other     1st degree relative <50    Health Maintenance  Topic Date Due  . ZOSTAVAX  02/21/1982  . PNA vac Low Risk Adult (2 of 2 - PCV13) 01/10/2012  . INFLUENZA VACCINE  11/09/2015  . TETANUS/TDAP  11/28/2020    No Known Allergies  Allergies as of 05/15/16   No Known Allergies     Medication List       Accurate as of 15-May-2016 12:23 PM. Always use your most recent med list.          acetaminophen 650 MG CR tablet Commonly known as:  TYLENOL 650 mg. Two tablets every 6 hours   ATIVAN IJ Apply 0.5 mg topically. Apply every 6 hours as needed for anxiety   carteolol 1 % ophthalmic solution Commonly known as:  OCUPRESS Place 1 drop into both eyes 2 (two) times daily.   lisinopril-hydrochlorothiazide 10-12.5 MG tablet Commonly known as:  PRINZIDE,ZESTORETIC Take 1 tablet by mouth. Take one tablet daily for blood pressure   mirtazapine 15 MG disintegrating tablet Commonly known as:  REMERON SOL-TAB Take 15 mg by mouth. Take one at bedtime   polyvinyl alcohol 1.4 % ophthalmic solution Commonly known as:  LIQUIFILM TEARS Place 1 drop into both eyes. One drop every 6 hours as needed for dry eyes   TRAVATAN Z  0.004 % Soln ophthalmic solution Generic drug:  Travoprost (BAK Free) Place 1 drop into both eyes at bedtime.   zinc oxide 20 % ointment Apply 1 application topically. Apply to reddened areas to buttocks twice daily to help prevent ski breakdown until resolved.       Review of Systems  Constitutional: Positive for activity change, appetite change and fatigue. Negative for fever and unexpected weight change.  HENT: Positive for hearing loss. Negative for congestion, ear pain, rhinorrhea, sore throat, tinnitus, trouble swallowing and voice change.   Eyes:       Corrective lenses  Respiratory: Negative for cough, choking,  chest tightness, shortness of breath and wheezing.   Cardiovascular: Positive for leg swelling. Negative for chest pain and palpitations.  Gastrointestinal: Positive for constipation. Negative for abdominal distention, abdominal pain, diarrhea and nausea.  Endocrine: Negative for cold intolerance, heat intolerance, polydipsia, polyphagia and polyuria.  Genitourinary: Negative for dysuria, frequency, testicular pain and urgency.       Not incontinent. Hx of prostate cancer.  Musculoskeletal: Positive for gait problem (using walker). Negative for arthralgias, back pain, myalgias and neck pain.       Chronic contracture of the right fifth finger PIP joint. Dupuytren's contracture of the fifth finger bilaterally.  Skin: Positive for wound (multiple skin tears of the right arm and left hand). Negative for color change, pallor and rash.       Excoriated buttocks  Allergic/Immunologic: Negative.   Neurological: Negative for dizziness, tremors, syncope, speech difficulty, weakness, numbness and headaches.       Severe Dementia with behavioral challenges.  Hematological: Negative for adenopathy. Does not bruise/bleed easily.  Psychiatric/Behavioral: Positive for behavioral problems, confusion, decreased concentration and dysphoric mood. Negative for hallucinations and sleep  disturbance. The patient is not nervous/anxious.     Vitals:   04/29/2016 1200  BP: (!) 125/56  Pulse: 80  Resp: 18  Temp: (!) 96.4 F (35.8 C)  SpO2: 93%  Weight: 123 lb 8 oz (56 kg)  Height: 6' (1.829 m)   Body mass index is 16.75 kg/m. Physical Exam  Constitutional: He appears well-developed. No distress.  Appears to be dying  HENT:  Right Ear: External ear normal.  Left Ear: External ear normal.  Nose: Nose normal.  Mouth/Throat: Oropharynx is clear and moist. No oropharyngeal exudate.  Bilateral hearing loss  Eyes: Conjunctivae and EOM are normal. Pupils are equal, round, and reactive to light.  Neck: No JVD present. No tracheal deviation present. No thyromegaly present.  Cardiovascular: Regular rhythm, normal heart sounds and intact distal pulses.  Exam reveals no gallop and no friction rub.   No murmur heard. Severe bradycardia  Pulmonary/Chest: No respiratory distress. He has no wheezes. He has no rales. He exhibits no tenderness.  Abdominal: He exhibits no distension and no mass. There is no tenderness.  Musculoskeletal: Normal range of motion. He exhibits edema (1+ bipedal) and tenderness.  unstable gait, lower back pain.   Lymphadenopathy:    He has no cervical adenopathy.  Neurological: He has normal reflexes. No cranial nerve deficit. Coordination abnormal.  Poorly responsive. Flaccid extremities. Severely dementia 04/29/15 MMSE 21/30. Failed clockdrawing.  Skin: No rash noted. No erythema. No pallor.  Cyanotic. Healing skin tear of the left elbow.  Psychiatric: His speech is normal. His affect is labile and inappropriate. He is agitated, aggressive and combative. Cognition and memory are impaired. He expresses impulsivity and inappropriate judgment. He exhibits abnormal recent memory and abnormal remote memory.  Episodes of agitation and anger.  He is inattentive.    Labs reviewed: Basic Metabolic Panel:  Recent Labs  06/21/15 1328 07/22/15 1446   12/30/15 03/30/16 04/06/16  NA 139 140  < > 140 145 142  K 4.3 3.8  < > 4.0 4.6 4.0  CL 103 106  --   --   --   --   CO2 31 25  --   --   --   --   GLUCOSE 114* 130*  --   --   --   --   BUN 25* 23*  < > 23* 73* 53*  CREATININE 1.03  0.90  < > 0.9 1.4* 1.2  CALCIUM 9.1 8.7*  --   --   --   --   < > = values in this interval not displayed. Liver Function Tests:  Recent Labs  05/13/15 07/22/15 1446 03/30/16  AST 16 20 22   ALT 10 15* 16  ALKPHOS 83 51 68  BILITOT  --  1.3*  --   PROT  --  7.0  --   ALBUMIN  --  3.6  --    No results for input(s): LIPASE, AMYLASE in the last 8760 hours. No results for input(s): AMMONIA in the last 8760 hours. CBC:  Recent Labs  07/22/15 1446 10/27/15 1138 03/30/16  WBC 11.6* 6.1 6.5  NEUTROABS 8.9* 3.6  --   HGB 12.7* 13.8 13.3*  HCT 37.4* 41.2 39*  MCV 97.1 97.4  --   PLT 268 271.0 288   Cardiac Enzymes: No results for input(s): CKTOTAL, CKMB, CKMBINDEX, TROPONINI in the last 8760 hours. BNP: Invalid input(s): POCBNP No results found for: HGBA1C Lab Results  Component Value Date   TSH 1.45 03/30/2016   Lab Results  Component Value Date   [id number] 10/27/2015   No results found for: FOLATE No results found for: IRON, TIBC, FERRITIN  Imaging and Procedures obtained prior to SNF admission: Ct Head Wo Contrast  Result Date: 03/22/2016 CLINICAL DATA:  83 year old male status post fall. Uncooperative during imaging. Initial encounter. EXAM: CT HEAD WITHOUT CONTRAST CT CERVICAL SPINE WITHOUT CONTRAST TECHNIQUE: Multidetector CT imaging of the head and cervical spine was performed following the standard protocol without intravenous contrast. Multiplanar CT image reconstructions of the cervical spine were also generated. COMPARISON:  CT head and cervical spine 07/11/2015. Brain MRI 07/23/2015. FINDINGS: CT HEAD FINDINGS Brain: Stable cerebral volume. Dural calcifications including along the interhemispheric fissure. Patchy and  confluent cerebral white matter hypodensity and bilateral deep gray matter heterogeneity. Stable gray-white matter differentiation throughout the brain. No midline shift, ventriculomegaly, mass effect, evidence of mass lesion, intracranial hemorrhage or evidence of cortically based acute infarction. Vascular: Calcified atherosclerosis at the skull base. Skull: No acute osseous abnormality identified. Sinuses/Orbits: Opacification of the right sphenoid sinus is unchanged. Other Visualized paranasal sinuses and mastoids are stable and well pneumatized. Other: No scalp hematoma identified. No acute orbit or scalp soft tissue finding. CT CERVICAL SPINE FINDINGS Alignment: Stable cervical vertebral height and alignment. Cervicothoracic junction alignment is within normal limits. Bilateral posterior element alignment is within normal limits. Mild anterolisthesis at C3-C4, C4-C5, and C5-C6. Associated moderate to severe facet arthropathy, greater on the right. Skull base and vertebrae: Visualized skull base is intact. No atlanto-occipital dissociation. 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 Mild motion artifact through the C3 level. No acute cervical spine fracture identified. Soft tissues and spinal canal: No prevertebral fluid or swelling. No visible canal hematoma. Negative for age noncontrast CT appearance of the neck. Disc levels: Chronic mid and lower cervical spine disc space loss and endplate degeneration appears stable. Moderate to severe right greater than left facet arthropathy. Evidence of chronic interbody and posterior element ankylosis at C6-C7. Upper chest: Visualized upper thoracic levels appear intact. Stable mild apical lung scarring. IMPRESSION: 1. No acute intracranial abnormality. Stable non contrast CT appearance of the brain. 2. Stable CT appearance of the cervical spine, no acute fracture or listhesis identified. 3.  No acute traumatic injury identified. 4. Advanced  cervical facet arthropathy in the setting of multilevel mild upper cervical spondylolisthesis. Evidence of  chronic C6-C7 ankylosis, with adjacent segment advanced disc and endplate degeneration. Electronically Signed   By: Genevie Ann M.D.   On: 03/22/2016 12:47   Ct Cervical Spine Wo Contrast  Result Date: 03/22/2016 CLINICAL DATA:  83 year old male status post fall. Uncooperative during imaging. Initial encounter. EXAM: CT HEAD WITHOUT CONTRAST CT CERVICAL SPINE WITHOUT CONTRAST TECHNIQUE: Multidetector CT imaging of the head and cervical spine was performed following the standard protocol without intravenous contrast. Multiplanar CT image reconstructions of the cervical spine were also generated. COMPARISON:  CT head and cervical spine 07/11/2015. Brain MRI 07/23/2015. FINDINGS: CT HEAD FINDINGS Brain: Stable cerebral volume. Dural calcifications including along the interhemispheric fissure. Patchy and confluent cerebral white matter hypodensity and bilateral deep gray matter heterogeneity. Stable gray-white matter differentiation throughout the brain. No midline shift, ventriculomegaly, mass effect, evidence of mass lesion, intracranial hemorrhage or evidence of cortically based acute infarction. Vascular: Calcified atherosclerosis at the skull base. Skull: No acute osseous abnormality identified. Sinuses/Orbits: Opacification of the right sphenoid sinus is unchanged. Other Visualized paranasal sinuses and mastoids are stable and well pneumatized. Other: No scalp hematoma identified. No acute orbit or scalp soft tissue finding. CT CERVICAL SPINE FINDINGS Alignment: Stable cervical vertebral height and alignment. Cervicothoracic junction alignment is within normal limits. Bilateral posterior element alignment is within normal limits. Mild anterolisthesis at C3-C4, C4-C5, and C5-C6. Associated moderate to severe facet arthropathy, greater on the right. Skull base and vertebrae: Visualized skull base is intact. No  atlanto-occipital dissociation. 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 Mild motion artifact through the C3 level. No acute cervical spine fracture identified. Soft tissues and spinal canal: No prevertebral fluid or swelling. No visible canal hematoma. Negative for age noncontrast CT appearance of the neck. Disc levels: Chronic mid and lower cervical spine disc space loss and endplate degeneration appears stable. Moderate to severe right greater than left facet arthropathy. Evidence of chronic interbody and posterior element ankylosis at C6-C7. Upper chest: Visualized upper thoracic levels appear intact. Stable mild apical lung scarring. IMPRESSION: 1. No acute intracranial abnormality. Stable non contrast CT appearance of the brain. 2. Stable CT appearance of the cervical spine, no acute fracture or listhesis identified. 3.  No acute traumatic injury identified. 4. Advanced cervical facet arthropathy in the setting of multilevel mild upper cervical spondylolisthesis. Evidence of chronic C6-C7 ankylosis, with adjacent segment advanced disc and endplate degeneration. Electronically Signed   By: Genevie Ann M.D.   On: 03/22/2016 12:47   Dg Hand Complete Right  Result Date: 03/22/2016 CLINICAL DATA:  Fall, right hand laceration and pain, initial encounter. EXAM: RIGHT HAND - COMPLETE 3+ VIEW COMPARISON:  None. FINDINGS: Hand is contracted, limiting evaluation.  No definite fracture. IMPRESSION: Hand is contracted, limiting evaluation.  No definite fracture. Electronically Signed   By: Lorin Picket M.D.   On: 03/22/2016 12:29    Assessment/Plan 1. Bradycardia Rate 35. NCB/ DNR. Will not transfer to the hospital peer prvioulsly expressed desire of patient and family.  2. Cyanosis I believe related to circulatory failure  3. Dying care Stop lisinopril-HCTZ  4. Vascular dementia with behavior disturbance Most likely cause of death  5. Adult failure to thrive Related to  dementia    Addendum: patient expired today

## 2016-05-11 DEATH — deceased

## 2018-12-03 IMAGING — CR DG HAND COMPLETE 3+V*R*
3 series · 3 of 3 positions shown · non-contrast
Comparison: None.

CLINICAL DATA: Fall, right hand laceration and pain, initial
encounter.

EXAM:
RIGHT HAND - COMPLETE 3+ VIEW

[x hand pa right]
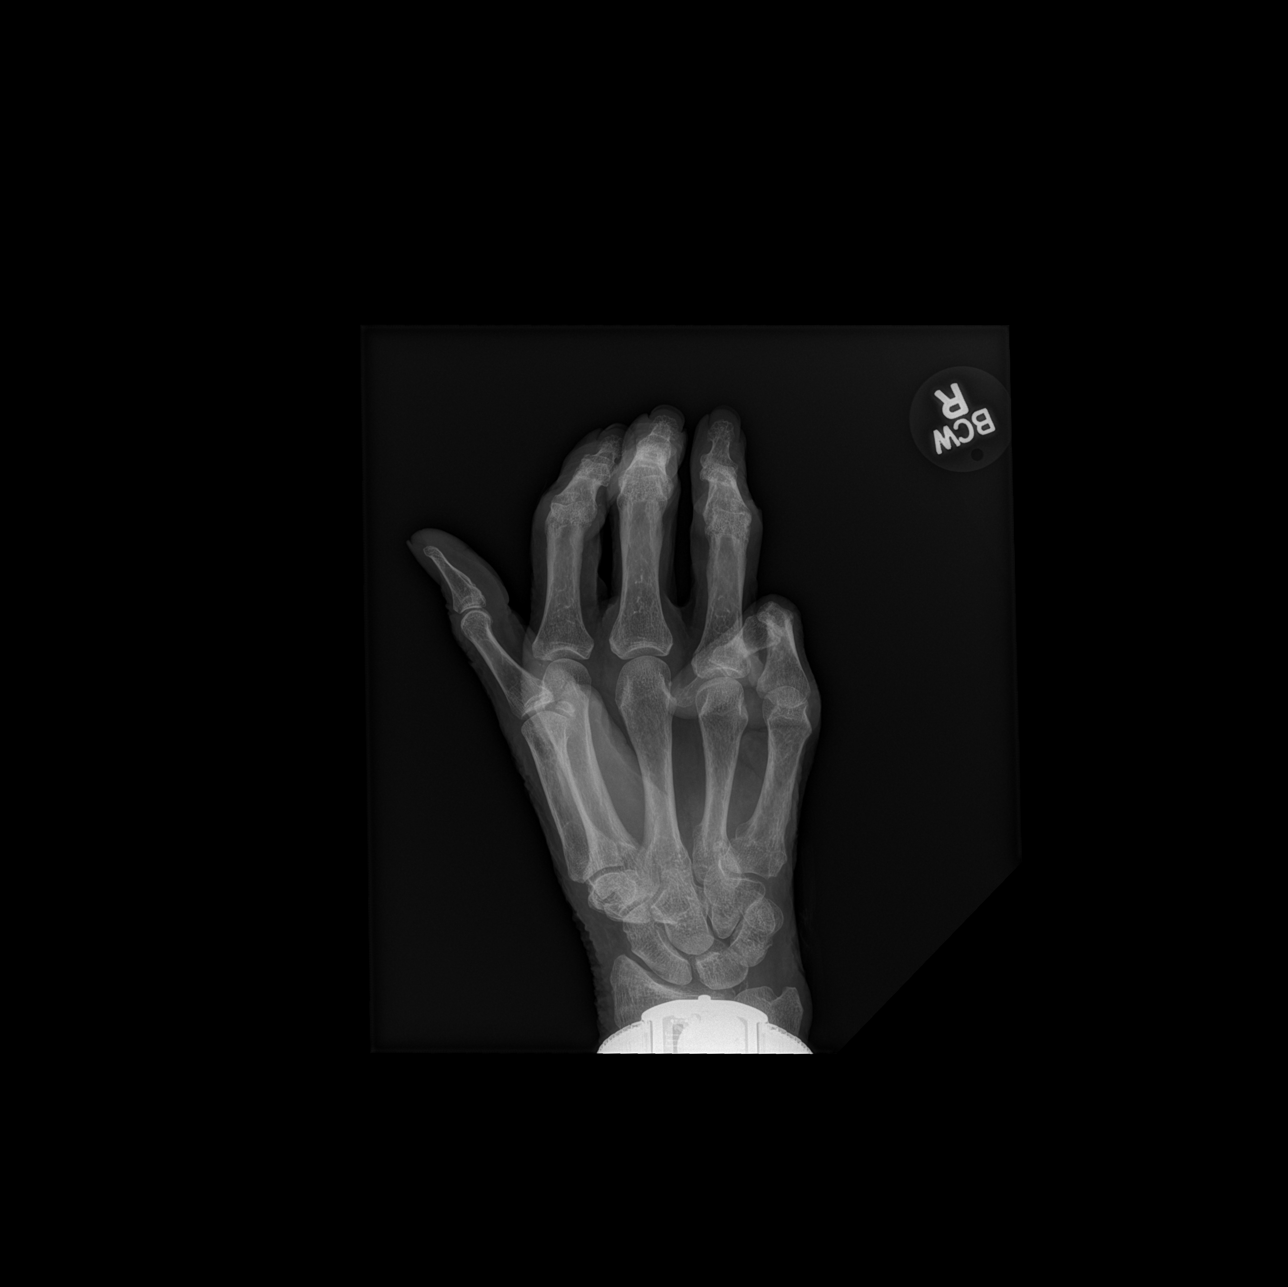

[x hand obl right]
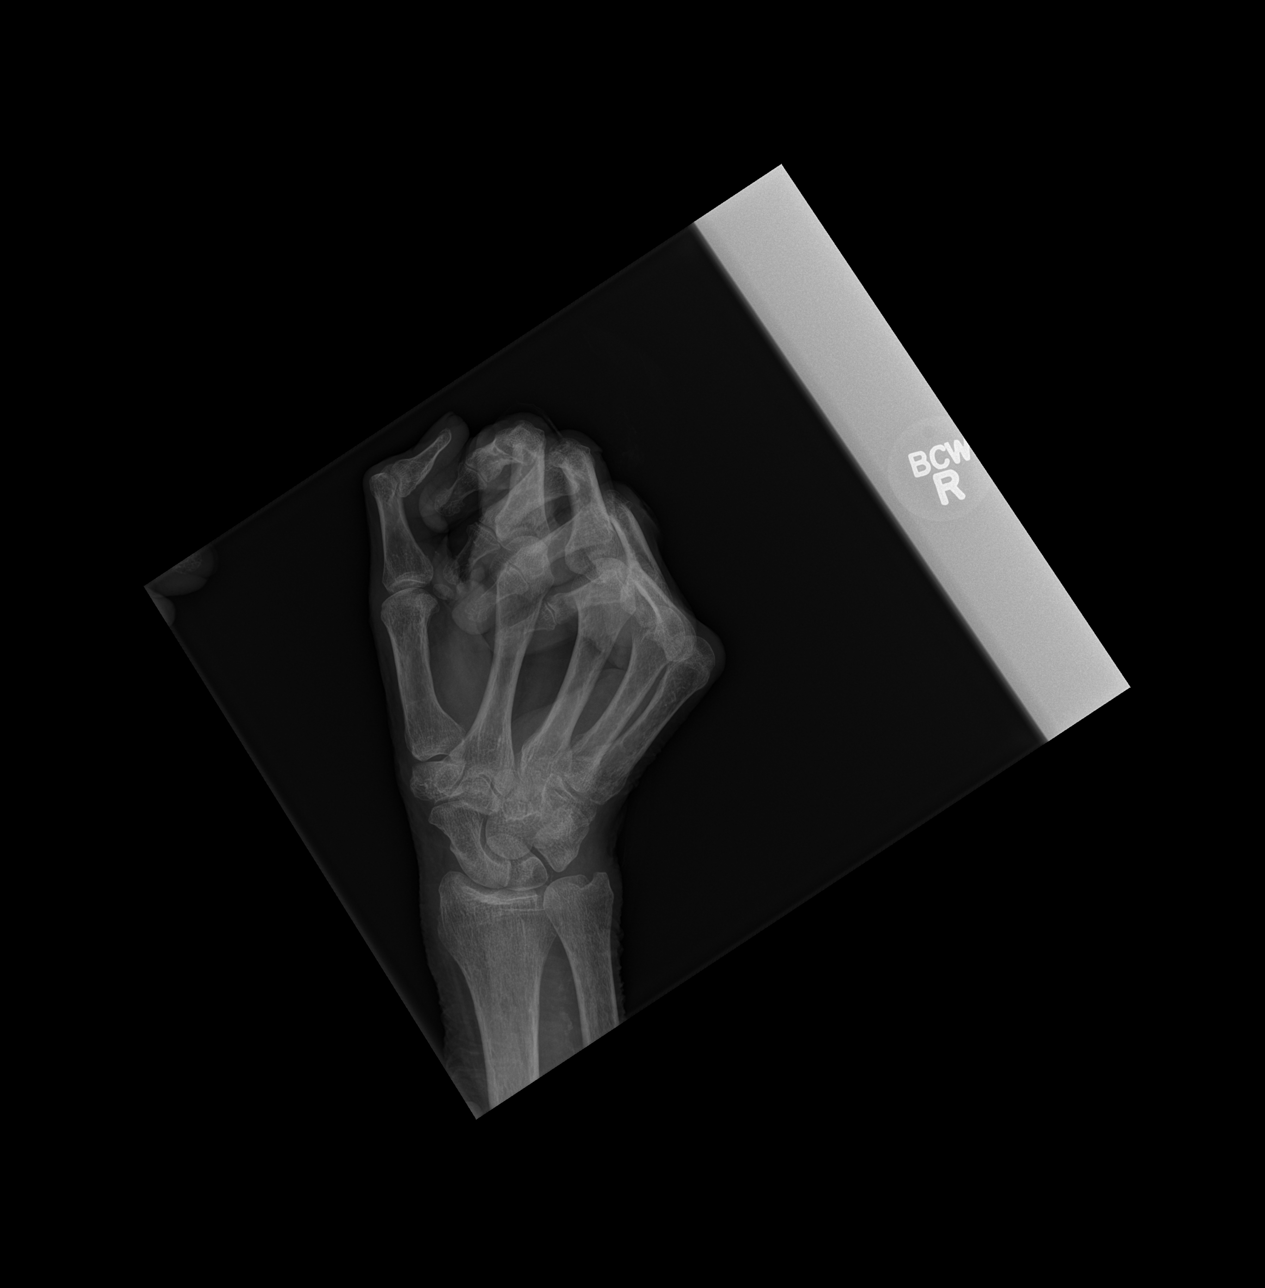

[x hand lat right]
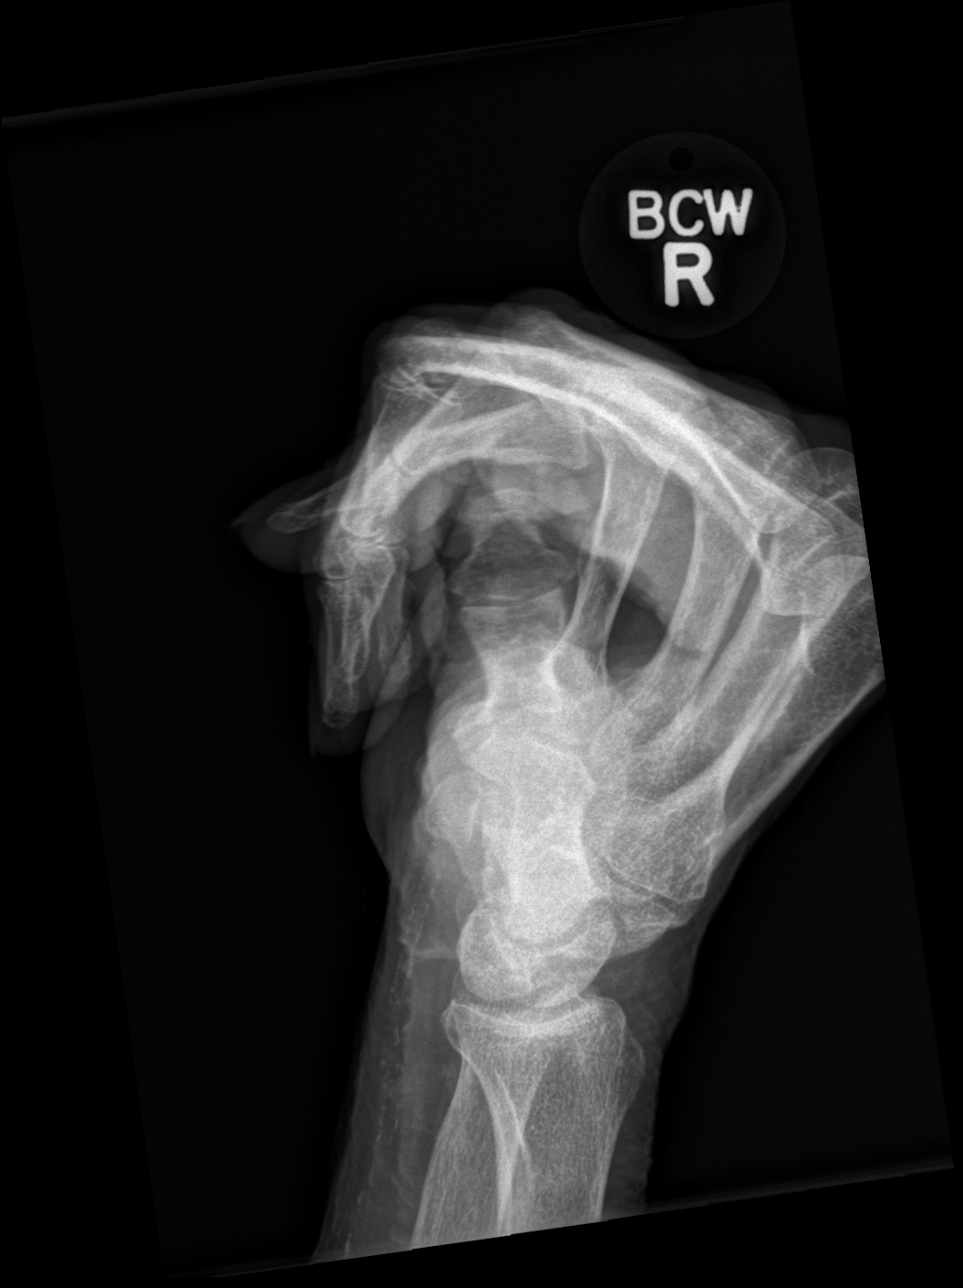

[3 of 3 positions shown; findings below may reference images not displayed]

FINDINGS: Hand is contracted, limiting evaluation.  No definite fracture.
IMPRESSION: Hand is contracted, limiting evaluation.  No definite fracture.

## 2018-12-03 IMAGING — CT CT CERVICAL SPINE W/O CM
3 of 12 series · 9 of 33 positions shown, 10 images · non-contrast
Comparison: CT head and cervical spine 07/11/2015. Brain MRI
07/23/2015.

CLINICAL DATA: [AGE] male status post fall. Uncooperative
during imaging. Initial encounter.

EXAM:
CT HEAD WITHOUT CONTRAST
CT CERVICAL SPINE WITHOUT CONTRAST
TECHNIQUE: Multidetector CT imaging of the head and cervical spine was
performed following the standard protocol without intravenous
contrast. Multiplanar CT image reconstructions of the cervical spine
were also generated.

[Series 7: coronal · coronal · 0.25mm/px · 3 of 73 slices shown]
[im 19/73  bone]
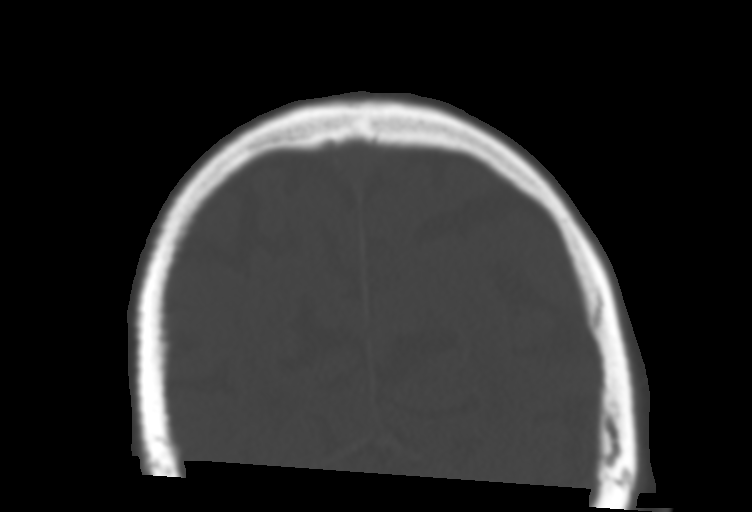
[im 37/73  bone]
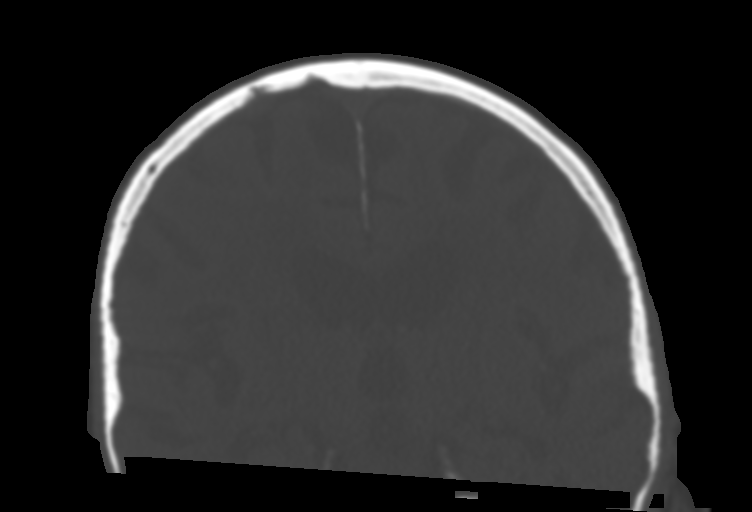
[im 55/73  bone]
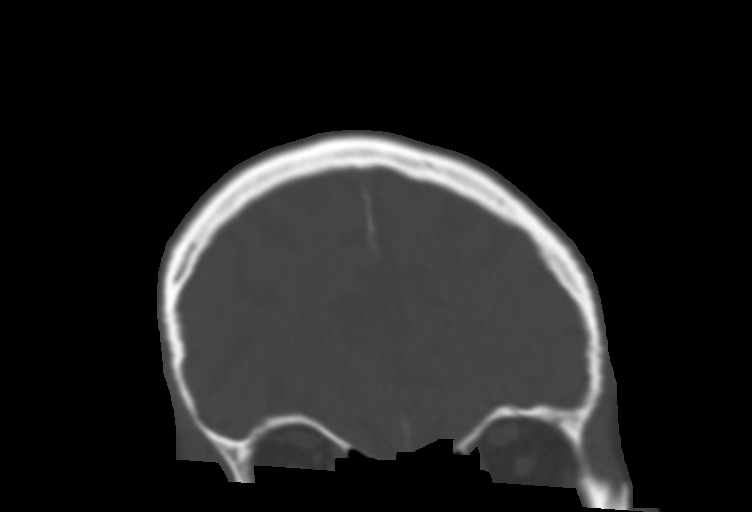

[Series 18: axial recon · axial · 0.30mm/px · z∈[+1438,+1538]mm · 3 of 120 slices shown, 4 images]
[im 30/120  soft-tissue]
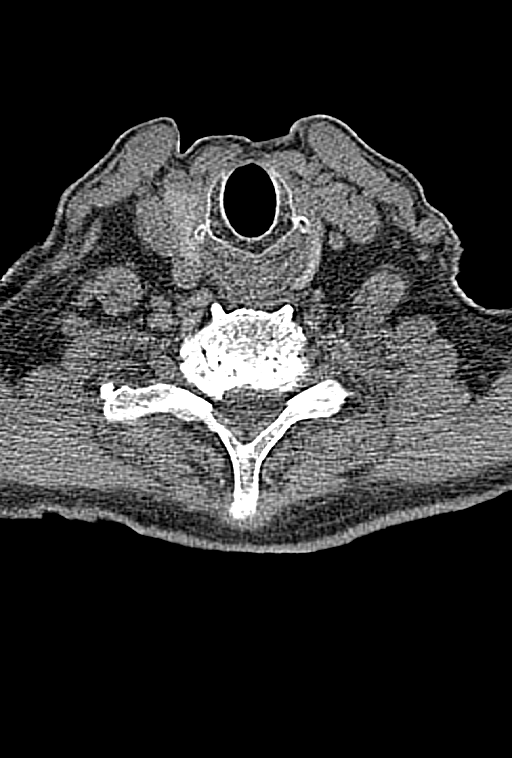
[im 30/120  bone]
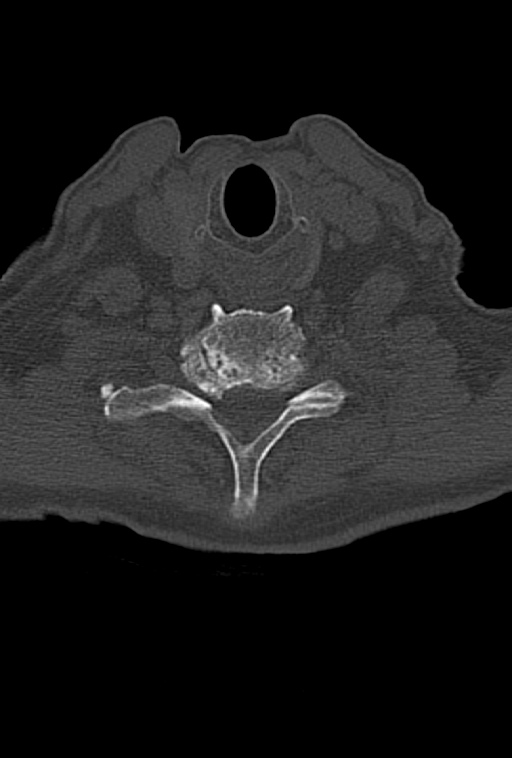
[im 60/120  bone]
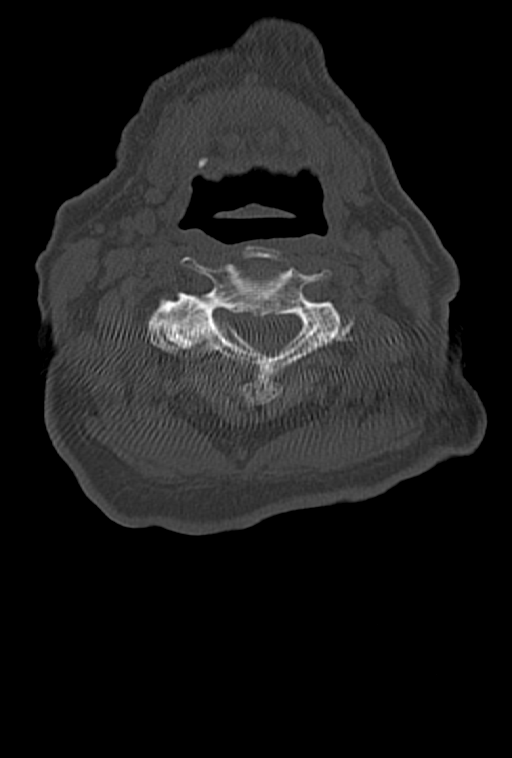
[im 90/120  bone]
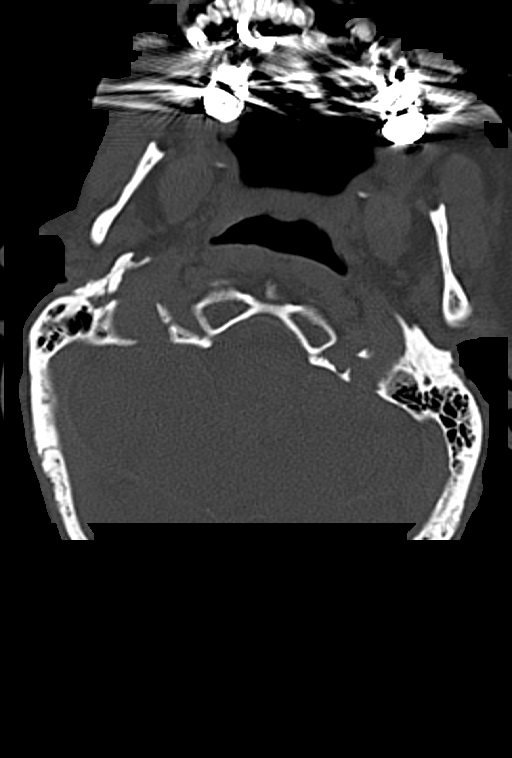

[Series 20: sagittal · sagittal · 0.30mm/px · 3 of 76 slices shown]
[im 19/76  bone]
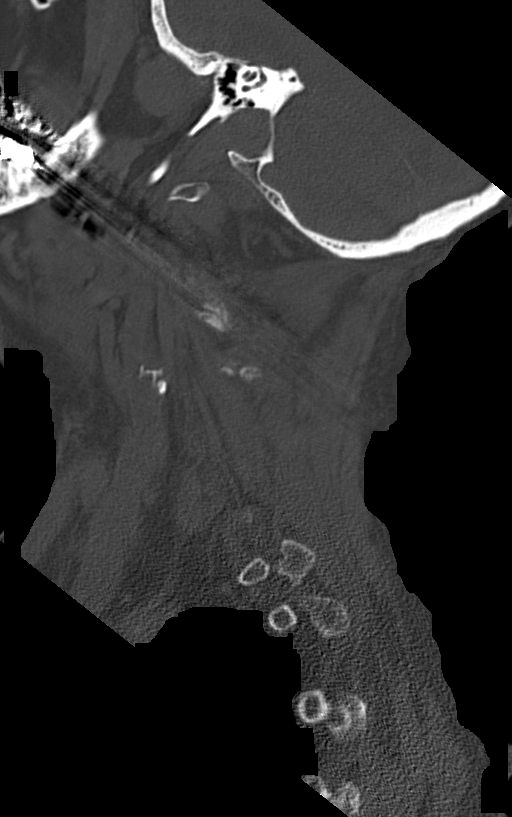
[im 38/76  bone]
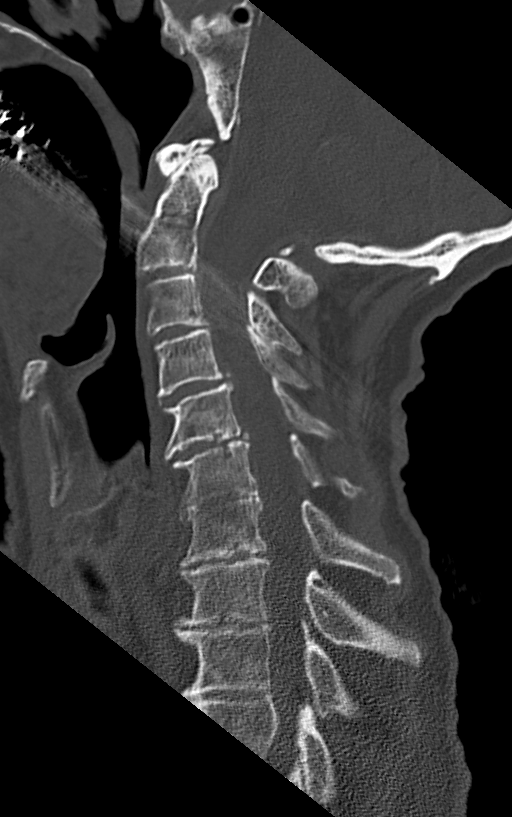
[im 57/76  bone]
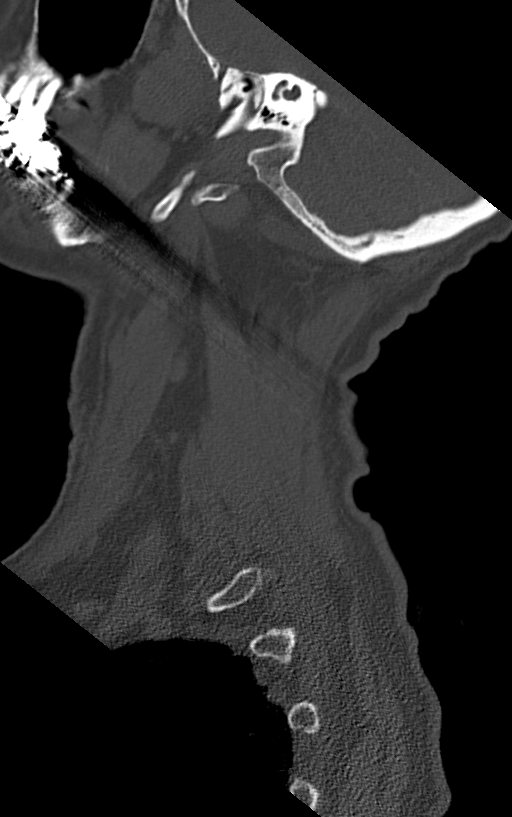

[9 of 33 positions shown; findings below may reference images not displayed]

FINDINGS: CT HEAD FINDINGS

Brain: Stable cerebral volume. Dural calcifications including along
the interhemispheric fissure. Patchy and confluent cerebral white
matter hypodensity and bilateral deep gray matter heterogeneity.
Stable gray-white matter differentiation throughout the brain.

No midline shift, ventriculomegaly, mass effect, evidence of mass
lesion, intracranial hemorrhage or evidence of cortically based
acute infarction.

Vascular: Calcified atherosclerosis at the skull base.

Skull: No acute osseous abnormality identified.

Sinuses/Orbits: Opacification of the right sphenoid sinus is
unchanged. Other Visualized paranasal sinuses and mastoids are
stable and well pneumatized.

Other: No scalp hematoma identified. No acute orbit or scalp soft
tissue finding.

CT CERVICAL SPINE FINDINGS

Alignment: Stable cervical vertebral height and alignment.
Cervicothoracic junction alignment is within normal limits.
Bilateral posterior element alignment is within normal limits. Mild
anterolisthesis at C3-C4, C4-C5, and C5-C6. Associated moderate to
severe facet arthropathy, greater on the right.

Skull base and vertebrae: Visualized skull base is intact. No
atlanto-occipital dissociation. Congenital incomplete fusion of the
posterior C1 ring again noted. Mild motion artifact through the C3
level. No acute cervical spine fracture identified.

Soft tissues and spinal canal: No prevertebral fluid or swelling. No
visible canal hematoma.

Negative for age noncontrast CT appearance of the neck.

Disc levels: Chronic mid and lower cervical spine disc space loss
and endplate degeneration appears stable. Moderate to severe right
greater than left facet arthropathy. Evidence of chronic interbody
and posterior element ankylosis at C6-C7.

Upper chest: Visualized upper thoracic levels appear intact. Stable
mild apical lung scarring.
IMPRESSION: 1. No acute intracranial abnormality. Stable non contrast CT
appearance of the brain.
2. Stable CT appearance of the cervical spine, no acute fracture or
listhesis identified.
3.  No acute traumatic injury identified.
4. Advanced cervical facet arthropathy in the setting of multilevel
mild upper cervical spondylolisthesis. Evidence of chronic C6-C7
ankylosis, with adjacent segment advanced disc and endplate
degeneration.
